# Patient Record
Sex: Male | Born: 1975
Health system: Southern US, Community
[De-identification: ages and names within clinical notes are randomized; demographics above are authoritative.]

## PROBLEM LIST (undated history)

## (undated) DIAGNOSIS — J302 Other seasonal allergic rhinitis: Secondary | ICD-10-CM

## (undated) HISTORY — PX: ABDOMINAL SURGERY: SHX537

---

## 2001-09-17 ENCOUNTER — Emergency Department (HOSPITAL_COMMUNITY): Admission: EM | Admit: 2001-09-17 | Discharge: 2001-09-17 | Payer: Self-pay | Admitting: Emergency Medicine

## 2005-08-27 ENCOUNTER — Emergency Department (HOSPITAL_COMMUNITY): Admission: EM | Admit: 2005-08-27 | Discharge: 2005-08-27 | Payer: Self-pay | Admitting: Emergency Medicine

## 2006-07-15 ENCOUNTER — Emergency Department (HOSPITAL_COMMUNITY): Admission: EM | Admit: 2006-07-15 | Discharge: 2006-07-15 | Payer: Self-pay | Admitting: Family Medicine

## 2006-09-30 ENCOUNTER — Emergency Department (HOSPITAL_COMMUNITY): Admission: EM | Admit: 2006-09-30 | Discharge: 2006-09-30 | Payer: Self-pay | Admitting: Emergency Medicine

## 2007-05-01 ENCOUNTER — Emergency Department (HOSPITAL_COMMUNITY): Admission: EM | Admit: 2007-05-01 | Discharge: 2007-05-01 | Payer: Self-pay | Admitting: Emergency Medicine

## 2008-06-25 ENCOUNTER — Emergency Department (HOSPITAL_COMMUNITY): Admission: EM | Admit: 2008-06-25 | Discharge: 2008-06-25 | Payer: Self-pay | Admitting: Emergency Medicine

## 2008-12-07 ENCOUNTER — Encounter (INDEPENDENT_AMBULATORY_CARE_PROVIDER_SITE_OTHER): Payer: Self-pay | Admitting: General Surgery

## 2008-12-07 ENCOUNTER — Inpatient Hospital Stay (HOSPITAL_COMMUNITY): Admission: EM | Admit: 2008-12-07 | Discharge: 2008-12-12 | Payer: Self-pay | Admitting: Emergency Medicine

## 2009-02-04 ENCOUNTER — Emergency Department (HOSPITAL_COMMUNITY): Admission: EM | Admit: 2009-02-04 | Discharge: 2009-02-04 | Payer: Self-pay | Admitting: Emergency Medicine

## 2009-07-28 ENCOUNTER — Emergency Department (HOSPITAL_COMMUNITY): Admission: EM | Admit: 2009-07-28 | Discharge: 2009-07-28 | Payer: Self-pay | Admitting: Family Medicine

## 2009-11-06 ENCOUNTER — Emergency Department (HOSPITAL_COMMUNITY): Admission: EM | Admit: 2009-11-06 | Discharge: 2009-11-07 | Payer: Self-pay | Admitting: Emergency Medicine

## 2010-05-14 ENCOUNTER — Emergency Department (HOSPITAL_COMMUNITY)
Admission: EM | Admit: 2010-05-14 | Discharge: 2010-05-14 | Payer: Self-pay | Source: Home / Self Care | Admitting: Emergency Medicine

## 2010-07-27 LAB — URINALYSIS, ROUTINE W REFLEX MICROSCOPIC
Glucose, UA: NEGATIVE mg/dL
Hgb urine dipstick: NEGATIVE
Ketones, ur: 15 mg/dL — AB
Protein, ur: NEGATIVE mg/dL
pH: 5 (ref 5.0–8.0)

## 2010-07-27 LAB — COMPREHENSIVE METABOLIC PANEL
AST: 15 U/L (ref 0–37)
Albumin: 3.8 g/dL (ref 3.5–5.2)
Alkaline Phosphatase: 49 U/L (ref 39–117)
BUN: 9 mg/dL (ref 6–23)
CO2: 23 mEq/L (ref 19–32)
GFR calc Af Amer: >60 mL/min (ref >60)
GFR calc non Af Amer: >60 mL/min (ref >60)
Glucose, Bld: 82 mg/dL (ref 70–99)
Potassium: 3.6 mEq/L (ref 3.5–5.1)
Total Bilirubin: 0.4 mg/dL (ref 0.3–1.2)
Total Protein: 7.3 g/dL (ref 6.0–8.3)

## 2010-07-27 LAB — URINE MICROSCOPIC-ADD ON

## 2010-07-27 LAB — DIFFERENTIAL
Basophils Absolute: 0 10*3/uL (ref 0.0–0.1)
Basophils Relative: 0 % (ref 0–1)
Eosinophils Absolute: 0 10*3/uL (ref 0.0–0.7)
Eosinophils Relative: 0 % (ref 0–5)
Lymphocytes Relative: 17 % (ref 12–46)
Monocytes Absolute: 0.5 10*3/uL (ref 0.1–1.0)
Neutro Abs: 5.9 10*3/uL (ref 1.7–7.7)

## 2010-07-27 LAB — CBC
MCV: 84.5 fL (ref 78.0–100.0)
RBC: 4.89 MIL/uL (ref 4.22–5.81)

## 2010-07-27 LAB — LIPASE, BLOOD: Lipase: 22 U/L (ref 11–59)

## 2010-07-29 LAB — BASIC METABOLIC PANEL
BUN: 18 mg/dL (ref 6–23)
CO2: 30 mEq/L (ref 19–32)
Calcium: 7.6 mg/dL — ABNORMAL LOW (ref 8.4–10.5)
Calcium: 8.4 mg/dL (ref 8.4–10.5)
Chloride: 106 mEq/L (ref 96–112)
Creatinine, Ser: 0.73 mg/dL (ref 0.4–1.5)
GFR calc Af Amer: >60 mL/min (ref >60)
GFR calc non Af Amer: >60 mL/min (ref >60)
GFR calc non Af Amer: >60 mL/min (ref >60)
Glucose, Bld: 103 mg/dL — ABNORMAL HIGH (ref 70–99)
Glucose, Bld: 225 mg/dL — ABNORMAL HIGH (ref 70–99)
Potassium: 4.4 mEq/L (ref 3.5–5.1)
Sodium: 137 mEq/L (ref 135–145)

## 2010-07-29 LAB — CROSSMATCH
ABO/RH(D): O POS
Antibody Screen: NEGATIVE

## 2010-07-29 LAB — CBC
HCT: 23.2 % — ABNORMAL LOW (ref 39.0–52.0)
HCT: 45.2 % (ref 39.0–52.0)
Hemoglobin: 14.9 g/dL (ref 13.0–17.0)
Hemoglobin: 6.5 g/dL — CL (ref 13.0–17.0)
Hemoglobin: 6.6 g/dL — CL (ref 13.0–17.0)
Hemoglobin: 7.2 g/dL — CL (ref 13.0–17.0)
Hemoglobin: 7.9 g/dL — CL (ref 13.0–17.0)
Hemoglobin: 8.1 g/dL — ABNORMAL LOW (ref 13.0–17.0)
Hemoglobin: 8.1 g/dL — ABNORMAL LOW (ref 13.0–17.0)
Hemoglobin: 8.6 g/dL — ABNORMAL LOW (ref 13.0–17.0)
MCHC: 34.1 g/dL (ref 30.0–36.0)
MCHC: 34.1 g/dL (ref 30.0–36.0)
MCHC: 34.3 g/dL (ref 30.0–36.0)
MCHC: 34.9 g/dL (ref 30.0–36.0)
MCHC: 35 g/dL (ref 30.0–36.0)
MCV: 85.2 fL (ref 78.0–100.0)
MCV: 86 fL (ref 78.0–100.0)
Platelets: 160 10*3/uL (ref 150–400)
RBC: 2.24 MIL/uL — ABNORMAL LOW (ref 4.22–5.81)
RBC: 2.28 MIL/uL — ABNORMAL LOW (ref 4.22–5.81)
RBC: 2.5 MIL/uL — ABNORMAL LOW (ref 4.22–5.81)
RBC: 2.71 MIL/uL — ABNORMAL LOW (ref 4.22–5.81)
RBC: 2.72 MIL/uL — ABNORMAL LOW (ref 4.22–5.81)
RBC: 2.92 MIL/uL — ABNORMAL LOW (ref 4.22–5.81)
RBC: 5.29 MIL/uL (ref 4.22–5.81)
RDW: 13.6 % (ref 11.5–15.5)
RDW: 14.1 % (ref 11.5–15.5)
RDW: 14.7 % (ref 11.5–15.5)
RDW: 14.7 % (ref 11.5–15.5)
WBC: 12.2 10*3/uL — ABNORMAL HIGH (ref 4.0–10.5)
WBC: 6.2 10*3/uL (ref 4.0–10.5)
WBC: 6.8 10*3/uL (ref 4.0–10.5)
WBC: 8.6 10*3/uL (ref 4.0–10.5)

## 2010-07-29 LAB — DIFFERENTIAL
Basophils Relative: 1 % (ref 0–1)
Eosinophils Absolute: 0 10*3/uL (ref 0.0–0.7)
Lymphocytes Relative: 17 % (ref 12–46)
Lymphs Abs: 1.1 10*3/uL (ref 0.7–4.0)

## 2010-07-29 LAB — COMPREHENSIVE METABOLIC PANEL
ALT: 13 U/L (ref 0–53)
ALT: 14 U/L (ref 0–53)
AST: 22 U/L (ref 0–37)
Albumin: 3.6 g/dL (ref 3.5–5.2)
Alkaline Phosphatase: 32 U/L — ABNORMAL LOW (ref 39–117)
Alkaline Phosphatase: 55 U/L (ref 39–117)
Calcium: 7.9 mg/dL — ABNORMAL LOW (ref 8.4–10.5)
GFR calc Af Amer: >60 mL/min (ref >60)
Glucose, Bld: 110 mg/dL — ABNORMAL HIGH (ref 70–99)
Potassium: 3.7 mEq/L (ref 3.5–5.1)
Sodium: 134 mEq/L — ABNORMAL LOW (ref 135–145)
Total Bilirubin: 0.7 mg/dL (ref 0.3–1.2)
Total Protein: 5 g/dL — ABNORMAL LOW (ref 6.0–8.3)
Total Protein: 7.2 g/dL (ref 6.0–8.3)

## 2010-07-29 LAB — PROTIME-INR
INR: 1.1 (ref 0.00–1.49)
INR: 1.3 (ref 0.00–1.49)
INR: 1.6 — ABNORMAL HIGH (ref 0.00–1.49)
Prothrombin Time: 14.2 seconds (ref 11.6–15.2)
Prothrombin Time: 15.3 seconds — ABNORMAL HIGH (ref 11.6–15.2)
Prothrombin Time: 16 seconds — ABNORMAL HIGH (ref 11.6–15.2)
Prothrombin Time: 19 seconds — ABNORMAL HIGH (ref 11.6–15.2)

## 2010-07-29 LAB — PREPARE RBC (CROSSMATCH)

## 2010-07-29 LAB — PREPARE FRESH FROZEN PLASMA

## 2010-07-29 LAB — APTT: aPTT: 26 seconds (ref 24–37)

## 2010-07-29 LAB — HEMOGLOBIN AND HEMATOCRIT, BLOOD
HCT: 18.5 % — ABNORMAL LOW (ref 39.0–52.0)
Hemoglobin: 10.6 g/dL — ABNORMAL LOW (ref 13.0–17.0)

## 2010-07-29 LAB — ABO/RH: ABO/RH(D): O POS
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
## 2010-09-25 ENCOUNTER — Emergency Department (HOSPITAL_COMMUNITY)
Admission: EM | Admit: 2010-09-25 | Discharge: 2010-09-25 | Discharge status: Against Medical Advice | Payer: Self-pay | Attending: Emergency Medicine | Admitting: Emergency Medicine

## 2010-09-25 DIAGNOSIS — Z0389 Encounter for observation for other suspected diseases and conditions ruled out: Secondary | ICD-10-CM | POA: Insufficient documentation

## 2012-12-28 ENCOUNTER — Encounter (HOSPITAL_COMMUNITY): Payer: Self-pay | Admitting: *Deleted

## 2012-12-28 ENCOUNTER — Emergency Department (INDEPENDENT_AMBULATORY_CARE_PROVIDER_SITE_OTHER)
Admission: EM | Admit: 2012-12-28 | Discharge: 2012-12-28 | Disposition: A | Discharge status: Home / Self Care | Payer: Self-pay | Source: Home / Self Care | Attending: Family Medicine | Admitting: Family Medicine

## 2012-12-28 DIAGNOSIS — L02419 Cutaneous abscess of limb, unspecified: Secondary | ICD-10-CM

## 2012-12-28 MED ORDER — MUPIROCIN CALCIUM 2 % EX CREA: TOPICAL_CREAM | Freq: Three times a day (TID) | CUTANEOUS | Status: DC

## 2012-12-28 MED ORDER — DOXYCYCLINE HYCLATE 100 MG PO CAPS: 100.0000 mg | ORAL_CAPSULE | Freq: Two times a day (BID) | ORAL | Status: DC

## 2012-12-28 NOTE — ED Notes (Signed)
C/O pressure to left temporal region and behind left eye since yesterday; c/o watering to left eye.  Also woke with bleeding gums; denies any unusual bruising or rashes, but c/o lesion behind left knee.  Tiny dome-shaped lesion noted with pinpoint dot in center and swelling surrounding lesion - c/o significant pain to area; denies any drainage.

## 2012-12-28 NOTE — ED Provider Notes (Signed)
CSN: [id number]     Arrival date & time 12/28/12  1104 History   First MD Initiated Contact with Patient 12/28/12 1135     Chief Complaint  Patient presents with  . Facial Pain  . Abscess   (Consider location/radiation/quality/duration/timing/severity/associated sxs/prior Treatment) Patient is a 37 y.o. male presenting with abscess. The history is provided by the patient.  Abscess Location:  Leg Leg abscess location:  L knee Abscess quality: painful and redness   Abscess quality: not draining and no fluctuance   Red streaking: no   Duration:  3 days Progression:  Unchanged Pain details:    Quality:  Burning   Severity:  Mild Chronicity:  New Risk factors: no prior abscess     History reviewed. No pertinent past medical history. History reviewed. No pertinent past surgical history. No family history on file. History  Substance Use Topics  . Smoking status: Former Smoker    Quit date: 12/23/2012  . Smokeless tobacco: Not on file  . Alcohol Use: No    Review of Systems  Constitutional: Negative.   Musculoskeletal: Negative.   Skin: Positive for rash.    Allergies  Review of patient's allergies indicates no known allergies.  Home Medications   Current Outpatient Rx  Name  Route  Sig  Dispense  Refill  . doxycycline (VIBRAMYCIN) 100 MG capsule   Oral   Take 1 capsule (100 mg total) by mouth 2 (two) times daily.   20 capsule   0   . mupirocin cream (BACTROBAN) 2 %   Topical   Apply topically 3 (three) times daily.   30 g   0    BP 132/82  Pulse 66  Temp(Src) 99.2 F (37.3 C) (Oral)  Resp 17  SpO2 100% Physical Exam  Nursing note and vitals reviewed. Constitutional: He is oriented to person, place, and time. He appears well-developed and well-nourished.  Neurological: He is alert and oriented to person, place, and time.  Skin: Skin is warm and dry. Rash noted.  Tender fluctuant abscess to left lat knee area.    ED Course  INCISION AND  DRAINAGE Date/Time: 12/28/2012 11:38 AM Performed by: Linna Hoff Authorized by: Bradd Canary D Consent: Verbal consent obtained. Risks and benefits: risks, benefits and alternatives were discussed Consent given by: patient Type: abscess Body area: lower extremity Location details: left leg Patient sedated: no Scalpel size: 11 Incision type: single straight Complexity: simple Drainage: purulent Drainage amount: moderate Wound treatment: wound left open Packing material: none Patient tolerance: Patient tolerated the procedure well with no immediate complications. Comments: Culture obtained,   (including critical care time) Labs Review Labs Reviewed  CULTURE, ROUTINE-ABSCESS   Imaging Review No results found.  MDM   1. Abscess of lower leg       Linna Hoff, MD 12/28/12 1145

## 2012-12-29 ENCOUNTER — Emergency Department (HOSPITAL_COMMUNITY)
Admission: EM | Admit: 2012-12-29 | Discharge: 2012-12-29 | Disposition: A | Discharge status: Home / Self Care | Payer: Self-pay | Attending: Emergency Medicine | Admitting: Emergency Medicine

## 2012-12-29 ENCOUNTER — Encounter (HOSPITAL_COMMUNITY): Payer: Self-pay | Admitting: Nurse Practitioner

## 2012-12-29 DIAGNOSIS — H5710 Ocular pain, unspecified eye: Secondary | ICD-10-CM | POA: Insufficient documentation

## 2012-12-29 DIAGNOSIS — Z87891 Personal history of nicotine dependence: Secondary | ICD-10-CM | POA: Insufficient documentation

## 2012-12-29 DIAGNOSIS — H5712 Ocular pain, left eye: Secondary | ICD-10-CM

## 2012-12-29 DIAGNOSIS — Z792 Long term (current) use of antibiotics: Secondary | ICD-10-CM | POA: Insufficient documentation

## 2012-12-29 DIAGNOSIS — R51 Headache: Secondary | ICD-10-CM | POA: Insufficient documentation

## 2012-12-29 MED ORDER — PROPARACAINE HCL 0.5 % OP SOLN
1.0000 [drp] | Freq: Once | OPHTHALMIC | Status: AC
  Administered 2012-12-29: 1 [drp] via OPHTHALMIC
  Filled 2012-12-29: qty 15

## 2012-12-29 MED ORDER — FLUORESCEIN SODIUM 1 MG OP STRP
1.0000 | ORAL_STRIP | Freq: Once | OPHTHALMIC | Status: AC
  Administered 2012-12-29: 1 via OPHTHALMIC
  Filled 2012-12-29: qty 1

## 2012-12-29 MED ORDER — FLUTICASONE PROPIONATE 50 MCG/ACT NA SUSP: 2.0000 | Freq: Every day | NASAL | Status: AC

## 2012-12-29 MED ORDER — TRAMADOL HCL 50 MG PO TABS: 50.0000 mg | ORAL_TABLET | Freq: Four times a day (QID) | ORAL | Status: DC | PRN

## 2012-12-29 NOTE — ED Provider Notes (Signed)
CSN: [id number]     Arrival date & time 12/29/12  1457 History  This chart was scribed for non-physician practitioner Wynetta Emery, PA-C working with Loren Racer, MD by Leone Payor, ED Scribe. This patient was seen in room TR04C/TR04C and the patient's care was started at 1457.    Chief Complaint  Patient presents with  . Eye Pain    The history is provided by the patient. No language interpreter was used.    HPI Comments: Travis Curry is a 37 y.o. male who presents to the Emergency Department complaining of 1 day of gradual onset, gradually worsening, constant left eye pain. He describes this pain as a "headache in the eye" with pain that "shoots backwards". He denies any known injuries or scratches to the affected eye. Pain is worse with left to right movement. He reports feeling something on to the top of the eye. He has an associated HA and eye tearing. He wears glasses regularly but denies use of contacts. He has taken tylenol with moderate relief. Pt is also being treated for sinusitis for which he was prescribed doxycycline BID yesterday. He denies rhinorrhea, visual disturbances, photophobia.   History reviewed. No pertinent past medical history. History reviewed. No pertinent past surgical history. History reviewed. No pertinent family history. History  Substance Use Topics  . Smoking status: Former Smoker    Quit date: 12/23/2012  . Smokeless tobacco: Not on file  . Alcohol Use: No    Review of Systems A complete 10 system review of systems was obtained and all systems are negative except as noted in the HPI and PMH.   Allergies  Chocolate  Home Medications   Current Outpatient Rx  Name  Route  Sig  Dispense  Refill  . DiphenhydrAMINE HCl (ALLERGY MED PO)   Oral   Take 1 tablet by mouth daily as needed (allergies).         Marland Kitchen doxycycline (VIBRAMYCIN) 100 MG capsule   Oral   Take 1 capsule (100 mg total) by mouth 2 (two) times daily.   20 capsule   0    . pseudoephedrine-acetaminophen (TYLENOL SINUS) 30-500 MG TABS   Oral   Take 1 tablet by mouth every 4 (four) hours as needed (pain).         . mupirocin cream (BACTROBAN) 2 %   Topical   Apply topically 3 (three) times daily.   30 g   0    BP 134/86  Pulse 71  Temp(Src) 98.5 F (36.9 C) (Oral)  Resp 18  SpO2 98% Physical Exam  Nursing note and vitals reviewed. Constitutional: He is oriented to person, place, and time. He appears well-developed and well-nourished. No distress.  HENT:  Head: Normocephalic.  Eyes: Conjunctivae and EOM are normal. Pupils are equal, round, and reactive to light.  No proptosis or  rash. Temporal arteries are non indurated and there is no TTP   OD 20/20, OS 20/20, OA 20/20  PERRL, EOMI intact with no pain or diplopia on movement, visual fields on confrontation wnl. No nystagmus.  Lids normal, no swelling, left eye bulbar conjunctiva shows moderate injection diffusely, no specific peri-limbic flush.   Lid eversion shows no foreign bodies.   Anterior chamber on slit lamp exam shows normal anterior chamber with no cells or flare, no rust rings noted.   Fluorescein stain on slit lamp exam reveals no corneal abrasion, or dentrites. Seidel sign negative.   Left-sided intraocular pressure of 15 and 8  Neck: Normal range of motion.  Cardiovascular: Normal rate.   Pulmonary/Chest: Effort normal. No stridor.  Abdominal: Soft. Bowel sounds are normal.  Musculoskeletal: Normal range of motion.  Neurological: He is alert and oriented to person, place, and time.  Psychiatric: He has a normal mood and affect.    ED Course  Procedures (including critical care time)  DIAGNOSTIC STUDIES: Oxygen Saturation is 98% on RA, normal by my interpretation.    COORDINATION OF CARE: 5:20 PM Discussed treatment plan with pt at bedside and pt agreed to plan.   Labs Review Labs Reviewed - No data to display Imaging Review No results found.  MDM  No  diagnosis found.  Filed Vitals:   12/29/12 1508  BP: 134/86  Pulse: 71  Temp: 98.5 F (36.9 C)  TempSrc: Oral  Resp: 18  SpO2: 98%     Travis Curry is a 37 y.o. male left eye pain and tearing worsening over the course of the last 4 days. Eye exam is completely within normal limits with no abnormalities noted. I discussed the case with attending Dr. Ranae Palms we have reviewed my exam. I think it is likely that this is a sinus headache or cluster headache rather than a ophthalmologic complaint. I will ask him t follow with an ophthalmologist for full exam and start him on pain medication. Attending physician agrees with assessment and care plan.  Medications  fluorescein ophthalmic strip 1 strip (1 strip Both Eyes Given 12/29/12 1655)  proparacaine (ALCAINE) 0.5 % ophthalmic solution 1 drop (1 drop Both Eyes Given 12/29/12 1655)    Pt is hemodynamically stable, appropriate for, and amenable to discharge at this time. Pt verbalized understanding and agrees with care plan. All questions answered. Outpatient follow-up and specific return precautions discussed.    Discharge Medication List as of 12/29/2012  5:31 PM    START taking these medications   Details  fluticasone (FLONASE) 50 MCG/ACT nasal spray Place 2 sprays into the nose daily., Starting 12/29/2012, Until Discontinued, Print    traMADol (ULTRAM) 50 MG tablet Take 1 tablet (50 mg total) by mouth every 6 (six) hours as needed for pain., Starting 12/29/2012, Until Discontinued, Print        I personally performed the services described in this documentation, which was scribed in my presence. The recorded information has been reviewed and is accurate.  Note: Portions of this report may have been transcribed using voice recognition software. Every effort was made to ensure accuracy; however, inadvertent computerized transcription errors may be present     Wynetta Emery, PA-C 12/31/12 0008

## 2012-12-29 NOTE — ED Notes (Addendum)
C/o L eye pain and tearing x 4 days. Denies any injuries. States his vision seems worse in left eye

## 2012-12-31 LAB — CULTURE, ROUTINE-ABSCESS: Special Requests: NORMAL

## 2012-12-31 NOTE — ED Provider Notes (Signed)
Medical screening examination/treatment/procedure(s) were performed by non-physician practitioner and as supervising physician I was immediately available for consultation/collaboration.   Zalaya Astarita, MD 12/31/12 1511 

## 2013-02-07 ENCOUNTER — Emergency Department (HOSPITAL_COMMUNITY): Payer: Self-pay

## 2013-02-07 ENCOUNTER — Encounter (HOSPITAL_COMMUNITY): Payer: Self-pay | Admitting: Emergency Medicine

## 2013-02-07 ENCOUNTER — Emergency Department (HOSPITAL_COMMUNITY)
Admission: EM | Admit: 2013-02-07 | Discharge: 2013-02-07 | Disposition: A | Discharge status: Home / Self Care | Payer: Self-pay | Attending: Emergency Medicine | Admitting: Emergency Medicine

## 2013-02-07 DIAGNOSIS — S92309A Fracture of unspecified metatarsal bone(s), unspecified foot, initial encounter for closed fracture: Secondary | ICD-10-CM | POA: Insufficient documentation

## 2013-02-07 DIAGNOSIS — Z87891 Personal history of nicotine dependence: Secondary | ICD-10-CM | POA: Insufficient documentation

## 2013-02-07 DIAGNOSIS — IMO0002 Reserved for concepts with insufficient information to code with codable children: Secondary | ICD-10-CM | POA: Insufficient documentation

## 2013-02-07 DIAGNOSIS — Z79899 Other long term (current) drug therapy: Secondary | ICD-10-CM | POA: Insufficient documentation

## 2013-02-07 DIAGNOSIS — S92302A Fracture of unspecified metatarsal bone(s), left foot, initial encounter for closed fracture: Secondary | ICD-10-CM

## 2013-02-07 DIAGNOSIS — Y9239 Other specified sports and athletic area as the place of occurrence of the external cause: Secondary | ICD-10-CM | POA: Insufficient documentation

## 2013-02-07 DIAGNOSIS — X500XXA Overexertion from strenuous movement or load, initial encounter: Secondary | ICD-10-CM | POA: Insufficient documentation

## 2013-02-07 DIAGNOSIS — Y9367 Activity, basketball: Secondary | ICD-10-CM | POA: Insufficient documentation

## 2013-02-07 MED ORDER — HYDROCODONE-ACETAMINOPHEN 5-325 MG PO TABS
2.0000 | ORAL_TABLET | Freq: Once | ORAL | Status: AC
  Administered 2013-02-07: 2 via ORAL
  Filled 2013-02-07: qty 2

## 2013-02-07 MED ORDER — HYDROCODONE-ACETAMINOPHEN 5-325 MG PO TABS: 1.0000 | ORAL_TABLET | Freq: Four times a day (QID) | ORAL | Status: DC | PRN

## 2013-02-07 NOTE — ED Notes (Signed)
Post op shoe with crutches were given to pt. Pt tolerated well and demonstrated use of crutches.

## 2013-02-07 NOTE — ED Notes (Signed)
Pt dc to home.  Pt sts understanding to dc instructions.  Pt taken to car via w/c.   

## 2013-02-07 NOTE — ED Provider Notes (Signed)
CSN: [id number]     Arrival date & time 02/07/13  1920 History  This chart was scribed for non-physician practitioner Dierdre Forth, PA-C  working with Gerhard Munch, MD by Clydene Laming, ED Scribe. This patient was seen in room TR06C/TR06C and the patient's care was started at 7:43 PM.   Chief Complaint  Patient presents with  . Foot Pain    The history is provided by the patient. No language interpreter was used.    HPI Comments: Travis Curry is a 37 y.o. male who presents to the Emergency Department complaining of left foot pain onset yesterday. Pt was playing basketball and went for a lay up and rolled his ankle when he landed. He reports he couldn't sleep due to throbbing of the foot. He states it was swollen this morning and could not apply pressure. He has been unable to weight bear since the initial fall.  He denies hitting his head, loss of consciousness, neck or back pain. He applied bengay and ice packs without relief. Patient has numbness, tingling or weakness.  History reviewed. No pertinent past medical history. History reviewed. No pertinent past surgical history. History reviewed. No pertinent family history. History  Substance Use Topics  . Smoking status: Former Smoker    Quit date: 12/23/2012  . Smokeless tobacco: Not on file  . Alcohol Use: No    Review of Systems  Constitutional: Negative for fever and chills.  Gastrointestinal: Negative for nausea and vomiting.  Musculoskeletal: Positive for arthralgias, gait problem and joint swelling. Negative for back pain, neck pain and neck stiffness.  Skin: Negative for wound.  Neurological: Negative for numbness.  Hematological: Does not bruise/bleed easily.  Psychiatric/Behavioral: The patient is not nervous/anxious.   All other systems reviewed and are negative.    Allergies  Chocolate  Home Medications   Current Outpatient Rx  Name  Route  Sig  Dispense  Refill  . DiphenhydrAMINE HCl (ALLERGY MED  PO)   Oral   Take 1 tablet by mouth daily as needed (allergies).         . fluticasone (FLONASE) 50 MCG/ACT nasal spray   Nasal   Place 2 sprays into the nose daily.   16 g   0   . HYDROcodone-acetaminophen (NORCO/VICODIN) 5-325 MG per tablet   Oral   Take 1-2 tablets by mouth every 6 (six) hours as needed for pain.   21 tablet   0   . pseudoephedrine-acetaminophen (TYLENOL SINUS) 30-500 MG TABS   Oral   Take 1 tablet by mouth every 4 (four) hours as needed (pain).          Pulse 78  Resp 20  SpO2 98% Physical Exam  Nursing note and vitals reviewed. Constitutional: He appears well-developed and well-nourished. No distress.  HENT:  Head: Normocephalic and atraumatic.  Eyes: Conjunctivae are normal.  Neck: Normal range of motion.  Cardiovascular: Normal rate, regular rhythm, normal heart sounds and intact distal pulses.   No murmur heard. Pulses:      Dorsalis pedis pulses are 2+ on the right side, and 2+ on the left side.       Posterior tibial pulses are 2+ on the right side, and 2+ on the left side.  Capillary refill less than 3 secs  Pulmonary/Chest: Effort normal and breath sounds normal.  Musculoskeletal: He exhibits tenderness. He exhibits no edema.  Decreased rom left ankle due to pain Strength is 5/5 Swelling circumfresnially of the left ankle into the dorsal of  the foot with ecchymosis Full rom of the toes of the left foot Significant pain to palpation at the base of the right 5th metatarsal of the left foot  Neurological: He is alert. Coordination normal.  Sensation intact Strength 5/5 including dorsaflexion and plantarflexion   Skin: Skin is warm and dry. He is not diaphoretic.  No tenting of the skin  Psychiatric: He has a normal mood and affect.    ED Course  Procedures (including critical care time) DIAGNOSTIC STUDIES: Oxygen Saturation is 98% on RA, normal by my interpretation.    COORDINATION OF CARE: 7:50 PM- Discussed treatment plan with  pt at bedside. Pt verbalized understanding and agreement with plan.   Labs Review Labs Reviewed - No data to display Imaging Review Dg Foot Complete Left  02/07/2013   CLINICAL DATA:  Twisted ankle, sports injury  EXAM: LEFT FOOT - COMPLETE 3+ VIEW  COMPARISON:  None.  FINDINGS: There is an avulsion fracture at the base of the 5th metatarsal.  IMPRESSION: Fracture of the base of the 5th metatarsal.   Electronically Signed   By: Genevive Bi M.D.   On: 02/07/2013 20:28    EKG Interpretation   None       MDM   1. Fracture of 5th metatarsal, left, closed, initial encounter    Travis Curry presents after foot injury last night.  Patient X-Ray with fracture of the base of the fifth metatarsal of the left foot. Pain managed in ED. Pt advised to follow up with orthopedics for further evaluation and treatment.  Pain managed in the department. Patient given post-op shoe and crutches while in ED, conservative therapy recommended and discussed. Patient will be dc home & is agreeable with above plan. I have also discussed reasons to return immediately to the ER.  Patient expresses understanding and agrees with plan.  It has been determined that no acute conditions requiring further emergency intervention are present at this time. The patient/guardian have been advised of the diagnosis and plan. We have discussed signs and symptoms that warrant return to the ED, such as changes or worsening in symptoms. Patient/guardian has voiced understanding and agreed to follow-up with the PCP or specialist.   I personally performed the services described in this documentation, which was scribed in my presence. The recorded information has been reviewed and is accurate.      Dahlia Client Tristina Sahagian, PA-C 02/07/13 2319

## 2013-02-07 NOTE — ED Provider Notes (Signed)
  Medical screening examination/treatment/procedure(s) were performed by non-physician practitioner and as supervising physician I was immediately available for consultation/collaboration.    Gerhard Munch, MD 02/07/13 [phone]

## 2013-02-07 NOTE — ED Notes (Addendum)
Pt states rolled left ankle yestesday, but the top of his foot is hurting not his ankle. Pt states swelling to the top of his left foot. Palpated pt's left ankle with no pain, but top of left foot painful to touch. Pt states that he was walking yesterday but today the pain is worse.

## 2014-07-19 ENCOUNTER — Emergency Department (INDEPENDENT_AMBULATORY_CARE_PROVIDER_SITE_OTHER)
Admission: EM | Admit: 2014-07-19 | Discharge: 2014-07-19 | Disposition: A | Discharge status: Home / Self Care | Payer: Self-pay | Source: Home / Self Care | Attending: Emergency Medicine | Admitting: Emergency Medicine

## 2014-07-19 ENCOUNTER — Encounter (HOSPITAL_COMMUNITY): Payer: Self-pay | Admitting: Emergency Medicine

## 2014-07-19 DIAGNOSIS — J301 Allergic rhinitis due to pollen: Secondary | ICD-10-CM

## 2014-07-19 DIAGNOSIS — R05 Cough: Secondary | ICD-10-CM

## 2014-07-19 DIAGNOSIS — R059 Cough, unspecified: Secondary | ICD-10-CM

## 2014-07-19 DIAGNOSIS — R0982 Postnasal drip: Secondary | ICD-10-CM

## 2014-07-19 MED ORDER — PREDNISONE 20 MG PO TABS
ORAL_TABLET | ORAL | Status: DC
Start: 2014-07-19 — End: 2016-04-17

## 2014-07-19 MED ORDER — ALBUTEROL SULFATE HFA 108 (90 BASE) MCG/ACT IN AERS: 2.0000 | INHALATION_SPRAY | RESPIRATORY_TRACT | Status: DC | PRN

## 2014-07-19 NOTE — ED Provider Notes (Signed)
CSN: [id number]     Arrival date & time 07/19/14  1028 History   First MD Initiated Contact with Patient 07/19/14 1223     Chief Complaint  Patient presents with  . URI   (Consider location/radiation/quality/duration/timing/severity/associated sxs/prior Treatment) HPI Comments: 39 year old male complaining of an excessive cough, pain in the throat upon coughing, PND, runny nose, watery eyes, decreased energy, occasional chills. He is a smoker. He denies fever. He has been taking Claritin with little to no relief. He was given a dose of TheraFlu last evening and was able to sleep mostly through the night. He frequently wakes up having coughing spasms with lots of mucus in the back of his throat.   History reviewed. No pertinent past medical history. History reviewed. No pertinent past surgical history. No family history on file. History  Substance Use Topics  . Smoking status: Former Smoker    Quit date: 12/23/2012  . Smokeless tobacco: Not on file  . Alcohol Use: No    Review of Systems  Constitutional: Positive for activity change and appetite change. Negative for fever.  HENT: Positive for congestion, postnasal drip, rhinorrhea and sneezing. Negative for ear pain and trouble swallowing.   Eyes: Negative.   Respiratory: Positive for cough. Negative for chest tightness and shortness of breath.   Cardiovascular: Negative for chest pain, palpitations and leg swelling.  Gastrointestinal: Negative.   Skin: Negative for rash.  Neurological: Negative.     Allergies  Chocolate  Home Medications   Prior to Admission medications   Medication Sig Start Date End Date Taking? Authorizing Provider  Chlorphen-Pseudoephed-APAP [facility](THERAFLU FLU/COLD PO) Take by mouth.   Yes Historical Provider, MD  Loratadine (CLARITIN PO) Take by mouth.   Yes Historical Provider, MD  albuterol (PROVENTIL HFA;VENTOLIN HFA) 108 (90 BASE) MCG/ACT inhaler Inhale 2 puffs into the lungs every 4 (four) hours as  needed for wheezing or shortness of breath. 07/19/14   Hayden Rasmussenavid Liller Yohn, NP  DiphenhydrAMINE HCl (ALLERGY MED PO) Take 1 tablet by mouth daily as needed (allergies).    Historical Provider, MD  fluticasone (FLONASE) 50 MCG/ACT nasal spray Place 2 sprays into the nose daily. 12/29/12   Nicole Pisciotta, PA-C  HYDROcodone-acetaminophen (NORCO/VICODIN) 5-325 MG per tablet Take 1-2 tablets by mouth every 6 (six) hours as needed for pain. 02/07/13   Hannah Muthersbaugh, PA-C  predniSONE (DELTASONE) 20 MG tablet Take 3 tabs po on first day, 2 tabs second day, 2 tabs third day, 1 tab fourth day, 1 tab 5th day. Take with food. 07/19/14   Hayden Rasmussenavid Larry Alcock, NP  pseudoephedrine-acetaminophen (TYLENOL SINUS) 30-500 MG TABS Take 1 tablet by mouth every 4 (four) hours as needed (pain).    Historical Provider, MD   BP 122/81 mmHg  Pulse 72  Temp(Src) 99.2 F (37.3 C) (Oral)  Resp 18  SpO2 100% Physical Exam  Constitutional: He is oriented to person, place, and time. He appears well-developed and well-nourished. No distress.  HENT:  Bilateral TMs are normal Oropharynx with minor erythema, cobblestoning and copious amount of thick clear PND.  Eyes: Conjunctivae and EOM are normal.  Neck: Normal range of motion. Neck supple.  Cardiovascular: Normal rate, regular rhythm and normal heart sounds.   Pulmonary/Chest: Effort normal.  Bilateral breath sounds are diminished. No wheezing or coarseness heard.  Musculoskeletal: He exhibits no edema.  Lymphadenopathy:    He has no cervical adenopathy.  Neurological: He is alert and oriented to person, place, and time.  Skin: Skin is warm and dry.  Psychiatric: He has a normal mood and affect.  Nursing note and vitals reviewed.   ED Course  Procedures (including critical care time) Labs Review Labs Reviewed - No data to display  Imaging Review No results found.   MDM   1. Allergic rhinitis due to pollen   2. PND (post-nasal drip)   3. Cough    For Daytime May use  Dayquil with Allegra 180 mg  Use lots of fluids and lots of saline nasal spray May continue the Theraflu or Nyquil at night Robitussin DM Albuterol HFA 2 puffs every 4 hours when necessary as directed Prednisone taper dose as directed Stop smoking    Hayden Rasmussen, NP 07/19/14 1338

## 2014-07-19 NOTE — ED Notes (Signed)
Reports runny nose, cough, sniffling, sore throat with cough, phlegm is white, unknown fever at home, but has had chills.  Onset Sunday 3/27

## 2014-07-19 NOTE — Discharge Instructions (Signed)
Allergic Rhinitis For Daytime May use Dayquil with Allegra 180 mg  Use lots of fluids and lots of saline nasal spray May continue the Theraflu or Nyquil at night Robitussin DM Allergic rhinitis is when the mucous membranes in the nose respond to allergens. Allergens are particles in the air that cause your body to have an allergic reaction. This causes you to release allergic antibodies. Through a chain of events, these eventually cause you to release histamine into the blood stream. Although meant to protect the body, it is this release of histamine that causes your discomfort, such as frequent sneezing, congestion, and an itchy, runny nose.  CAUSES  Seasonal allergic rhinitis (hay fever) is caused by pollen allergens that may come from grasses, trees, and weeds. Year-round allergic rhinitis (perennial allergic rhinitis) is caused by allergens such as house dust mites, pet dander, and mold spores.  SYMPTOMS   Nasal stuffiness (congestion).  Itchy, runny nose with sneezing and tearing of the eyes. DIAGNOSIS  Your health care provider can help you determine the allergen or allergens that trigger your symptoms. If you and your health care provider are unable to determine the allergen, skin or blood testing may be used. TREATMENT  Allergic rhinitis does not have a cure, but it can be controlled by:  Medicines and allergy shots (immunotherapy).  Avoiding the allergen. Hay fever may often be treated with antihistamines in pill or nasal spray forms. Antihistamines block the effects of histamine. There are over-the-counter medicines that may help with nasal congestion and swelling around the eyes. Check with your health care provider before taking or giving this medicine.  If avoiding the allergen or the medicine prescribed do not work, there are many new medicines your health care provider can prescribe. Stronger medicine may be used if initial measures are ineffective. Desensitizing injections can  be used if medicine and avoidance does not work. Desensitization is when a patient is given ongoing shots until the body becomes less sensitive to the allergen. Make sure you follow up with your health care provider if problems continue. HOME CARE INSTRUCTIONS It is not possible to completely avoid allergens, but you can reduce your symptoms by taking steps to limit your exposure to them. It helps to know exactly what you are allergic to so that you can avoid your specific triggers. SEEK MEDICAL CARE IF:   You have a fever.  You develop a cough that does not stop easily (persistent).  You have shortness of breath.  You start wheezing.  Symptoms interfere with normal daily activities. Document Released: 01/02/2001 Document Revised: 04/14/2013 Document Reviewed: 12/15/2012 [facility] Patient Information 2015 [employer], [place]. This information is not intended to replace advice given to you by your health care provider. Make sure you discuss any questions you have with your health care provider.

## 2015-04-14 ENCOUNTER — Emergency Department (HOSPITAL_COMMUNITY)
Admission: EM | Admit: 2015-04-14 | Discharge: 2015-04-14 | Disposition: A | Discharge status: Home / Self Care | Payer: Self-pay | Attending: Emergency Medicine | Admitting: Emergency Medicine

## 2015-04-14 ENCOUNTER — Encounter (HOSPITAL_COMMUNITY): Payer: Self-pay

## 2015-04-14 DIAGNOSIS — Z7951 Long term (current) use of inhaled steroids: Secondary | ICD-10-CM | POA: Insufficient documentation

## 2015-04-14 DIAGNOSIS — Z79899 Other long term (current) drug therapy: Secondary | ICD-10-CM | POA: Insufficient documentation

## 2015-04-14 DIAGNOSIS — J209 Acute bronchitis, unspecified: Secondary | ICD-10-CM | POA: Insufficient documentation

## 2015-04-14 DIAGNOSIS — J40 Bronchitis, not specified as acute or chronic: Secondary | ICD-10-CM

## 2015-04-14 DIAGNOSIS — F17200 Nicotine dependence, unspecified, uncomplicated: Secondary | ICD-10-CM | POA: Insufficient documentation

## 2015-04-14 MED ORDER — AZITHROMYCIN 250 MG PO TABS: 250.0000 mg | ORAL_TABLET | Freq: Every day | ORAL | Status: DC

## 2015-04-14 MED ORDER — ALBUTEROL SULFATE HFA 108 (90 BASE) MCG/ACT IN AERS: 2.0000 | INHALATION_SPRAY | RESPIRATORY_TRACT | Status: DC | PRN

## 2015-04-14 MED ORDER — BENZONATATE 100 MG PO CAPS: 100.0000 mg | ORAL_CAPSULE | Freq: Three times a day (TID) | ORAL | Status: DC

## 2015-04-14 NOTE — ED Notes (Signed)
Pt presents with 5 day h/o cough.  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

## 2015-04-14 NOTE — Discharge Instructions (Signed)
Please obtain all of your results from medical records or have your doctors office obtain the results - share them with your doctor - you should be seen at your doctors office in the next 2 days. Call today to arrange your follow up. Take the medications as prescribed. Please review all of the medicines and only take them if you do not have an allergy to them. Please be aware that if you are taking birth control pills, taking other prescriptions, ESPECIALLY ANTIBIOTICS may make the birth control ineffective - if this is the case, either do not engage in sexual activity or use alternative methods of birth control such as condoms until you have finished the medicine and your family doctor says it is OK to restart them. If you are on a blood thinner such as COUMADIN, be aware that any other medicine that you take may cause the coumadin to either work too much, or not enough - you should have your coumadin level rechecked in next 7 days if this is the case.  °?  °It is also a possibility that you have an allergic reaction to any of the medicines that you have been prescribed - Everybody reacts differently to medications and while MOST people have no trouble with most medicines, you may have a reaction such as nausea, vomiting, rash, swelling, shortness of breath. If this is the case, please stop taking the medicine immediately and contact your physician.  °?  °You should return to the ER if you develop severe or worsening symptoms.  ° ° °Emergency Department Resource Guide °1) Find a Doctor and Pay Out of Pocket °Although you won't have to find out who is covered by your insurance plan, it is a good idea to ask around and get recommendations. You will then need to call the office and see if the doctor you have chosen will accept you as a new patient and what types of options they offer for patients who are self-pay. Some doctors offer discounts or will set up payment plans for their patients who do not have insurance,  but you will need to ask so you aren't surprised when you get to your appointment. ° °2) Contact Your Local Health Department °Not all health departments have doctors that can see patients for sick visits, but many do, so it is worth a call to see if yours does. If you don't know where your local health department is, you can check in your phone book. The CDC also has a tool to help you locate your state's health department, and many state websites also have listings of all of their local health departments. ° °3) Find a Walk-in Clinic °If your illness is not likely to be very severe or complicated, you may want to try a walk in clinic. These are popping up all over the country in pharmacies, drugstores, and shopping centers. They're usually staffed by nurse practitioners or physician assistants that have been trained to treat common illnesses and complaints. They're usually fairly quick and inexpensive. However, if you have serious medical issues or chronic medical problems, these are probably not your best option. ° °No Primary Care Doctor: °- Call [facility] at  832-8000 - they can help you locate a primary care doctor that  accepts your insurance, provides certain services, etc. °- Physician Referral Service- 1-800-533-3463 ° °Chronic Pain Problems: °Organization         Address  Phone   Notes  °[facility]  (336)   297-2271 Patients need to be referred by their primary care doctor.  ° °Medication Assistance: °Organization         Address  Phone   Notes  °[place] [facility] [address] °[place], [place] [address] (336) 641-8030 --Must be a resident of [place] °-- Must have NO insurance coverage whatsoever (no Medicaid/ Medicare, etc.) °-- The pt. MUST have a primary care doctor that directs their care regularly and follows them in the community °  °[facility]  (866) 331-1348   °[employer]  (888) 892-1162   ° °Agencies that provide inexpensive  medical care: °Organization         Address  Phone   Notes  °[facility]  (336) 832-8035   °[facility]    (336) 832-7272   °[facility] [address] °[place], [place] [address] (336) 832-4777   °[facility] [address], °[place] (336) 271-4999   °[facility]    (336) 373-0678   °[facility]    (336) 272-1050   °[facility] ° [address], [place] Phone:  (336) 832-4444, Fax:  (336) 832-4440 Hours of Operation:  9 am - 6 pm, M-F.  Also accepts Medicaid/Medicare and self-pay.  °[facility] ° [address], [place] Phone: (336) 832-3150, Fax: (336) 832-3151. Hours of Operation:  8:30 am - 5:30 pm, M-F.  Also accepts Medicaid and self-pay.  °[facility] [place] [address], [place] Phone: (336) 878-6027   °[facility] [address], [place], [place] (336)723-1848, Ext. 123 Mondays & Thursdays: 7-9 AM.  First 15 patients are seen on a first come, first serve basis. °  ° °Medicaid-accepting [place] Providers: ° °Organization         Address  Phone   Notes  °[facility] [address], [place] (336) 641-2100 Also accepts self-pay patients.  °[facility] [address], [place] ° (336) 856-9996   °[facility] [address], [place] (336) 288-8857   °[facility] [address] [place] [address], [place] (336) 299-7000   °Veita Bland [address], [place]  ° (336) 373-1557 Only accepts [place] Access Medicaid patients after they have their name applied to their card.  ° °Self-Pay (no insurance) in [place]: ° °Organization         Address  Phone   Notes  °Sickle Cell Patients, [facility] [address], [place] (336) 832-1970   °[facility] [address], [place] (336) 832-4400   ° [facility] ° [address], [place] (336) 992-4800   °[facility]/Dr. Osei-Bonsu ° [address] [place] [address], [place] or [address], [place] (336) 841-8500 Phone number for both [place] and [place] locations is the same.  °[facility] [address], [place] (336) 299-0000   °[facility] [address] [place] [address], [place] or [address] (336) 852-7530 °(336) 878-2260   °[facility] [address], [place] (336) 350-1642, phone; (336) 294-5005, fax Sees patients 1st and 3rd Saturday of every month.  Must not qualify for public or private insurance (i.e. Medicaid, Medicare, [place] Health Choice, Veterans' Benefits) •   Household income should be no more than 200% of the poverty level •The clinic cannot treat you if you are pregnant or think you are pregnant • Sexually transmitted diseases are not treated at the clinic.  ° ° °Dental Care: °Organization         Address  Phone  Notes  °[facility] [address], [place] (336) 641-6152 Accepts children up to age 21 who are enrolled in Medicaid or [place] Health Choice; pregnant women with a Medicaid card; and children who have applied for Medicaid or [place] Health Choice, but were declined, whose parents can pay a reduced fee at time of service.  °[facility] [place]  [address], [place] (336) 641-7733 Accepts children up to age 21 who are enrolled in Medicaid or [place] Health Choice; pregnant women with a Medicaid card; and children who have applied for Medicaid or [place] Health Choice, but were declined, whose parents can pay a reduced fee at time of service.  °[facility] ° [address], [place] (336) 641-4533 Patients are seen by appointment only. Walk-ins are not accepted.  [facility] will see patients 18 years of age and older. °Monday - Tuesday (8am-5pm) °Most Wednesdays (8:30-5pm) °$30 per visit, cash only  °[facility] ° [address], [place] (336) 641-4533 Patients are seen by appointment only. Walk-ins are not accepted. [facility] will see patients 18 years of age and older. °One Wednesday Evening (Monthly: Volunteer Based).  $30 per visit, cash only  °[facility]  (919) 537-3737 for adults; Children under age 4, call Graduate Pediatric Dentistry at (919) [phone]. Children aged 4-14, please call (919) [phone] to request a pediatric application. ° Dental services are provided in all areas of dental care including fillings, crowns and bridges, complete and partial dentures, implants, gum treatment, root canals, and extractions. Preventive care is also provided. Treatment is provided to both adults and children. °Patients are selected via a lottery and there is often a waiting list. °  °[facility] [address], °[place] ° (336) 763-8833 [email or web address] °  °[facility] [address], [place], [place] (336)723-1848, Ext. 123 Second and Fourth Thursday of each month, opens at 6:30 AM; Clinic ends at 9 AM.  Patients are seen on a first-come first-served basis, and a limited number are seen during each clinic.  ° °[facility] ° [address], [place], [place] (336) 723-7904   Eligibility Requirements °You must have lived in [place], [place], or [place] counties for at least the last three months. °  You cannot be eligible for state or federal sponsored healthcare insurance, including Veterans Administration, Medicaid, or Medicare. °  You generally cannot be eligible for healthcare insurance through your employer.  °  How to apply: °Eligibility screenings are held every Tuesday and Wednesday afternoon from 1:00 pm until 4:00 pm. You do not need an appointment for the  interview!  °[facility] [address], [place], [place] 336-631-2330   °[facility]  336-342-8273   °[facility]  336-703-3100   °[facility]  336-570-6415   ° °Behavioral Health Resources in the Community: °Intensive Outpatient Programs °Organization         Address  Phone  Notes  °[facility] [address], [place],   [place] 336-878-6098   °[facility] [address], [place], [place] 336-832-9800   °[facility] [address], [place], [place] ° 336-882-2125   °[facility] [address],  °[place], [place] 1-800-853-5163 or 336-641-4981   °Substance Abuse Resources °Organization         Address  Phone  Notes  °[facility]  336-882-2125   °[facility]  336-784-9470   °[facility]  336-285-9073   °[facility]  336-845-3988   °Residential & Outpatient Substance Abuse Program  1-800-659-3381   °Psychological Services °Organization         Address  Phone  Notes  °[facility]  336- 832-9600   °[facility]  336- 378-7881   °[facility] [address], [place] 1-800-853-5163 or 336-641-4981   ° °Mobile Crisis Teams °Organization         Address  Phone  Notes  °[facility], Mobile Crisis Care Unit  1-877-626-1772   °Assertive °[facility] ° [address] [place], [place] 336-834-9664   °Sharon DeEsch [address] °[place] 336-554-5454   ° °Self-Help/Support Groups °Organization         Address  Phone             Notes  °[facility] - variety of support groups  336- 373-1402 Call for more information  °Narcotics Anonymous (NA), [facility] [address], °[place]  2 meetings at this location  ° °Residential Treatment Programs °Organization         Address  Phone  Notes  °[facility]  [address],    °[place]  1-866-801-8205   °[facility] ° [address], [place], [place] 704-293-8524   °[facility] [address], [place] 336-845-3988 Admissions: 8am-3pm M-F  °[facility] [address]    °[place], [place] 336-841-1104   °[facility] [address], [place], [place] 336-379-7146   °[facility] [address]  °[place], [place] 336-285-9073   °[facility] Intensive Outpatient [address], [place], [place] 336-852-3033   °[facility] [address]  °[place], [place] 1-877-615-2722 or 336-784-9470   °[facility] [address] [place], [place] 336-227-7417 Accepts Medicaid  °[facility] [address]  °[place] 1-800-659-3381 Substance Abuse/Addiction Treatment  ° °[place] Behavioral Health Resources °Organization         Address  Phone  Notes  °[facility]  (888) 581-9988   °Julie Brannon, PhD [address] [place], [place]   (336) 349-5553 or (336) 951-0000   °[facility]   [address] °[place], [place] (336) 349-4454   °[facility] [address], [place], [place] (336) 342-8316 Insurance/Medicaid/sponsorship through [facility]  °[facility] [address]                                    [place], [place] (336) 342-8316 Therapy/tele-psych/case  °[facility] [address]  ° [place], [place] (336) 349-2233    °Dr. Arfeen  (336) 349-4544   °[facility] [place]  [employer] [place] [facility] 1) [address], [place] °2) [address], [place] °3)  [address], [place] (336) 349-3220 °(336) 342-7768 ° °(336) 342-8140   °[place] [facility] (336) 342-1394 or (336) 342-3537 (After Hours)    ° ° ° °
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
## 2015-08-14 ENCOUNTER — Emergency Department (HOSPITAL_COMMUNITY)
Admission: EM | Admit: 2015-08-14 | Discharge: 2015-08-14 | Disposition: A | Discharge status: Home / Self Care | Payer: Self-pay | Attending: Emergency Medicine | Admitting: Emergency Medicine

## 2015-08-14 ENCOUNTER — Encounter (HOSPITAL_COMMUNITY): Payer: Self-pay | Admitting: *Deleted

## 2015-08-14 DIAGNOSIS — F17200 Nicotine dependence, unspecified, uncomplicated: Secondary | ICD-10-CM | POA: Insufficient documentation

## 2015-08-14 DIAGNOSIS — Z7951 Long term (current) use of inhaled steroids: Secondary | ICD-10-CM | POA: Insufficient documentation

## 2015-08-14 DIAGNOSIS — Z79899 Other long term (current) drug therapy: Secondary | ICD-10-CM | POA: Insufficient documentation

## 2015-08-14 DIAGNOSIS — J069 Acute upper respiratory infection, unspecified: Secondary | ICD-10-CM

## 2015-08-14 NOTE — Discharge Instructions (Signed)
Use OTC sinus, cold and flu medications such as tylenol, motrin, delsym, mucinex or other found at your local pharmacy. Return to ED with new, worsening or concerning symptoms.    Upper Respiratory Infection, Adult Most upper respiratory infections (URIs) are a viral infection of the air passages leading to the lungs. A URI affects the nose, throat, and upper air passages. The most common type of URI is nasopharyngitis and is typically referred to as "the common cold." URIs run their course and usually go away on their own. Most of the time, a URI does not require medical attention, but sometimes a bacterial infection in the upper airways can follow a viral infection. This is called a secondary infection. Sinus and middle ear infections are common types of secondary upper respiratory infections. Bacterial pneumonia can also complicate a URI. A URI can worsen asthma and chronic obstructive pulmonary disease (COPD). Sometimes, these complications can require emergency medical care and may be life threatening.  CAUSES Almost all URIs are caused by viruses. A virus is a type of germ and can spread from one person to another.  RISKS FACTORS You may be at risk for a URI if:   You smoke.   You have chronic heart or lung disease.  You have a weakened defense (immune) system.   You are very young or very old.   You have nasal allergies or asthma.  You work in crowded or poorly ventilated areas.  You work in health care facilities or schools. SIGNS AND SYMPTOMS  Symptoms typically develop 2-3 days after you come in contact with a cold virus. Most viral URIs last 7-10 days. However, viral URIs from the influenza virus (flu virus) can last 14-18 days and are typically more severe. Symptoms may include:   Runny or stuffy (congested) nose.   Sneezing.   Cough.   Sore throat.   Headache.   Fatigue.   Fever.   Loss of appetite.   Pain in your forehead, behind your eyes, and  over your cheekbones (sinus pain).  Muscle aches.  DIAGNOSIS  Your health care provider may diagnose a URI by:  Physical exam.  Tests to check that your symptoms are not due to another condition such as:  Strep throat.  Sinusitis.  Pneumonia.  Asthma. TREATMENT  A URI goes away on its own with time. It cannot be cured with medicines, but medicines may be prescribed or recommended to relieve symptoms. Medicines may help:  Reduce your fever.  Reduce your cough.  Relieve nasal congestion. HOME CARE INSTRUCTIONS   Take medicines only as directed by your health care provider.   Gargle warm saltwater or take cough drops to comfort your throat as directed by your health care provider.  Use a warm mist humidifier or inhale steam from a shower to increase air moisture. This may make it easier to breathe.  Drink enough fluid to keep your urine clear or pale yellow.   Eat soups and other clear broths and maintain good nutrition.   Rest as needed.   Return to work when your temperature has returned to normal or as your health care provider advises. You may need to stay home longer to avoid infecting others. You can also use a face mask and careful hand washing to prevent spread of the virus.  Increase the usage of your inhaler if you have asthma.   Do not use any tobacco products, including cigarettes, chewing tobacco, or electronic cigarettes. If you need help quitting, ask  your health care provider. PREVENTION  The best way to protect yourself from getting a cold is to practice good hygiene.   Avoid oral or hand contact with people with cold symptoms.   Wash your hands often if contact occurs.  There is no clear evidence that vitamin C, vitamin E, echinacea, or exercise reduces the chance of developing a cold. However, it is always recommended to get plenty of rest, exercise, and practice good nutrition.  SEEK MEDICAL CARE IF:   You are getting worse rather than  better.   Your symptoms are not controlled by medicine.   You have chills.  You have worsening shortness of breath.  You have brown or red mucus.  You have yellow or brown nasal discharge.  You have pain in your face, especially when you bend forward.  You have a fever.  You have swollen neck glands.  You have pain while swallowing.  You have white areas in the back of your throat. SEEK IMMEDIATE MEDICAL CARE IF:   You have severe or persistent:  Headache.  Ear pain.  Sinus pain.  Chest pain.  You have chronic lung disease and any of the following:  Wheezing.  Prolonged cough.  Coughing up blood.  A change in your usual mucus.  You have a stiff neck.  You have changes in your:  Vision.  Hearing.  Thinking.  Mood. MAKE SURE YOU:   Understand these instructions.  Will watch your condition.  Will get help right away if you are not doing well or get worse.   This information is not intended to replace advice given to you by your health care provider. Make sure you discuss any questions you have with your health care provider.   Document Released: 10/03/2000 Document Revised: 08/24/2014 Document Reviewed: 07/15/2013 Elsevier Interactive Patient Education [employer].

## 2015-08-14 NOTE — ED Notes (Signed)
Pt reports worse cough starting last night.

## 2015-08-14 NOTE — ED Notes (Signed)
Declined W/C at D/C and was escorted to lobby by RN. 
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
## 2016-04-17 ENCOUNTER — Emergency Department (HOSPITAL_COMMUNITY)
Admission: EM | Admit: 2016-04-17 | Discharge: 2016-04-17 | Disposition: A | Discharge status: Home / Self Care | Payer: Self-pay | Attending: Emergency Medicine | Admitting: Emergency Medicine

## 2016-04-17 ENCOUNTER — Encounter (HOSPITAL_COMMUNITY): Payer: Self-pay | Admitting: *Deleted

## 2016-04-17 DIAGNOSIS — F17200 Nicotine dependence, unspecified, uncomplicated: Secondary | ICD-10-CM | POA: Insufficient documentation

## 2016-04-17 DIAGNOSIS — J40 Bronchitis, not specified as acute or chronic: Secondary | ICD-10-CM | POA: Insufficient documentation

## 2016-04-17 MED ORDER — BENZONATATE 100 MG PO CAPS
100.0000 mg | ORAL_CAPSULE | Freq: Once | ORAL | Status: AC
  Administered 2016-04-17: 100 mg via ORAL
  Filled 2016-04-17: qty 1

## 2016-04-17 MED ORDER — BENZONATATE 100 MG PO CAPS: 100.0000 mg | ORAL_CAPSULE | Freq: Three times a day (TID) | ORAL | 0 refills | Status: DC

## 2016-04-17 MED ORDER — PREDNISONE 20 MG PO TABS
60.0000 mg | ORAL_TABLET | Freq: Once | ORAL | Status: AC
  Administered 2016-04-17: 60 mg via ORAL
  Filled 2016-04-17: qty 3

## 2016-04-17 MED ORDER — ALBUTEROL SULFATE HFA 108 (90 BASE) MCG/ACT IN AERS
2.0000 | INHALATION_SPRAY | Freq: Once | RESPIRATORY_TRACT | Status: AC
  Administered 2016-04-17: 2 via RESPIRATORY_TRACT
  Filled 2016-04-17: qty 6.7

## 2016-04-17 MED ORDER — PREDNISONE 20 MG PO TABS: 40.0000 mg | ORAL_TABLET | Freq: Every day | ORAL | 0 refills | Status: DC
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
## 2016-04-17 NOTE — ED Triage Notes (Signed)
Patient presents stating he feels like his bronchitis has flared up again.  +cough with slight production of clear white   Lungs clear  Inhaler out antibiotics completed

## 2016-04-17 NOTE — Discharge Instructions (Signed)
This is likely bronchitis. You need to take the prednisone 2 tablets once a day for 3 days. You also need to use the inhaler as needed. Please take the tessalon as prescribed for cough. Return to the ED if your symptoms worsen or if you develop fevers, cough up more thick sputum, or for any other reason. Follow up with your primary care doctor as needed.

## 2016-04-17 NOTE — ED Notes (Signed)
Pt sts he was not dx w/ bronchitis, he just believes he has the same s/s as when he was dx w/ bronchitis.

## 2016-05-03 ENCOUNTER — Emergency Department (HOSPITAL_COMMUNITY)
Admission: EM | Admit: 2016-05-03 | Discharge: 2016-05-03 | Disposition: A | Discharge status: Home / Self Care | Payer: Self-pay | Attending: Emergency Medicine | Admitting: Emergency Medicine

## 2016-05-03 ENCOUNTER — Encounter (HOSPITAL_COMMUNITY): Payer: Self-pay

## 2016-05-03 DIAGNOSIS — R21 Rash and other nonspecific skin eruption: Secondary | ICD-10-CM | POA: Insufficient documentation

## 2016-05-03 DIAGNOSIS — F17200 Nicotine dependence, unspecified, uncomplicated: Secondary | ICD-10-CM | POA: Insufficient documentation

## 2016-05-03 DIAGNOSIS — Z79899 Other long term (current) drug therapy: Secondary | ICD-10-CM | POA: Insufficient documentation

## 2016-05-03 MED ORDER — DIPHENHYDRAMINE HCL 25 MG PO CAPS: 25.0000 mg | ORAL_CAPSULE | Freq: Four times a day (QID) | ORAL | 0 refills | Status: DC | PRN

## 2016-05-03 MED ORDER — PERMETHRIN 5 % EX CREA: TOPICAL_CREAM | CUTANEOUS | 1 refills | Status: DC

## 2016-05-03 NOTE — Discharge Instructions (Signed)
Please read and follow all provided instructions.  Your diagnoses today include:  1. Rash and nonspecific skin eruption     Tests performed today include:  Vital signs. See below for your results today.   Medications prescribed:   Permethrin cream - Apply to entire body before bed and wash off in morning, repeat in one week if not resolved.   Benadryl (diphenhydramine) - antihistamine  You can find this medication over-the-counter.   DO NOT exceed:   50mg  Benadryl every 6 hours    Benadryl will make you drowsy. DO NOT drive or perform any activities that require you to be awake and alert if taking this.  Home care instructions:  Follow any educational materials contained in this packet.  You can use benadryl as directed on packaging for itching. You may also use loradine (Claritin) as directed as this medication will not make you very sleepy.   Do not use hydrocortisone or any other type of steroid cream -- it will not kill the scabies parasite and will make the rash worse.   You need to disinfect surfaces, vacuum floors, and wash all clothes and bedding in hot water.   Follow-up instructions: Please follow-up with your primary care provider as needed for further evaluation of your symptoms.  Return instructions:   Please return to the Emergency Department if you experience worsening symptoms.   Please return if you have any other emergent concerns.  Additional Information:  Your vital signs today were: BP 117/75 (BP Location: Left Arm)    Pulse 74    Temp 98.4 F (36.9 C) (Oral)    Resp 18    Ht 5\' 8"  (1.727 m)    Wt 63.5 kg    SpO2 100%    BMI 21.29 kg/m  If your blood pressure (BP) was elevated above 135/85 this visit, please have this repeated by your doctor within one month. ---------------

## 2016-05-03 NOTE — ED Triage Notes (Signed)
Per Pt, Pt is coming from home with complaints of rash noted to his groin. Pt denies any penile discharge. Pt reports rash itching.

## 2016-05-03 NOTE — ED Notes (Signed)
Video visit coupon #[id number]
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
## 2017-05-18 ENCOUNTER — Emergency Department (HOSPITAL_COMMUNITY)
Admission: EM | Admit: 2017-05-18 | Discharge: 2017-05-18 | Disposition: A | Discharge status: Home / Self Care | Payer: Commercial Managed Care - PPO | Attending: Emergency Medicine | Admitting: Emergency Medicine

## 2017-05-18 ENCOUNTER — Other Ambulatory Visit: Payer: Self-pay

## 2017-05-18 ENCOUNTER — Emergency Department (HOSPITAL_COMMUNITY): Payer: Commercial Managed Care - PPO

## 2017-05-18 ENCOUNTER — Encounter (HOSPITAL_COMMUNITY): Payer: Self-pay | Admitting: Emergency Medicine

## 2017-05-18 DIAGNOSIS — Z79899 Other long term (current) drug therapy: Secondary | ICD-10-CM | POA: Insufficient documentation

## 2017-05-18 DIAGNOSIS — N201 Calculus of ureter: Secondary | ICD-10-CM | POA: Diagnosis not present

## 2017-05-18 DIAGNOSIS — F17210 Nicotine dependence, cigarettes, uncomplicated: Secondary | ICD-10-CM | POA: Insufficient documentation

## 2017-05-18 DIAGNOSIS — R1032 Left lower quadrant pain: Secondary | ICD-10-CM | POA: Diagnosis present

## 2017-05-18 DIAGNOSIS — N1330 Unspecified hydronephrosis: Secondary | ICD-10-CM | POA: Diagnosis not present

## 2017-05-18 DIAGNOSIS — N132 Hydronephrosis with renal and ureteral calculous obstruction: Secondary | ICD-10-CM | POA: Insufficient documentation

## 2017-05-18 LAB — CBC
HEMATOCRIT: 43.5 % (ref 39.0–52.0)
HEMOGLOBIN: 14.6 g/dL (ref 13.0–17.0)
MCH: 27.2 pg (ref 26.0–34.0)
MCHC: 33.6 g/dL (ref 30.0–36.0)
MCV: 81 fL (ref 78.0–100.0)
Platelets: 301 10*3/uL (ref 150–400)
RBC: 5.37 MIL/uL (ref 4.22–5.81)
RDW: 13.6 % (ref 11.5–15.5)
WBC: 8.8 10*3/uL (ref 4.0–10.5)

## 2017-05-18 LAB — COMPREHENSIVE METABOLIC PANEL
ALBUMIN: 3.7 g/dL (ref 3.5–5.0)
ALK PHOS: 66 U/L (ref 38–126)
ALT: 17 U/L (ref 17–63)
ANION GAP: 14 (ref 5–15)
AST: 21 U/L (ref 15–41)
BUN: 10 mg/dL (ref 6–20)
CALCIUM: 9.3 mg/dL (ref 8.9–10.3)
CO2: 23 mmol/L (ref 22–32)
Chloride: 103 mmol/L (ref 101–111)
Creatinine, Ser: 1.28 mg/dL — ABNORMAL HIGH (ref 0.61–1.24)
GFR calc non Af Amer: >60 mL/min (ref >60)
GLUCOSE: 106 mg/dL — AB (ref 65–99)
POTASSIUM: 3.4 mmol/L — AB (ref 3.5–5.1)
Sodium: 140 mmol/L (ref 135–145)
TOTAL PROTEIN: 7.4 g/dL (ref 6.5–8.1)
Total Bilirubin: 0.4 mg/dL (ref 0.3–1.2)

## 2017-05-18 LAB — URINALYSIS, ROUTINE W REFLEX MICROSCOPIC
Bacteria, UA: NONE SEEN
Bilirubin Urine: NEGATIVE
Glucose, UA: NEGATIVE mg/dL
KETONES UR: NEGATIVE mg/dL
Leukocytes, UA: NEGATIVE
NITRITE: NEGATIVE
PH: 9 — AB (ref 5.0–8.0)
Protein, ur: 30 mg/dL — AB
Specific Gravity, Urine: 1.023 (ref 1.005–1.030)

## 2017-05-18 LAB — LIPASE, BLOOD: Lipase: 35 U/L (ref 11–51)

## 2017-05-18 MED ORDER — OXYCODONE-ACETAMINOPHEN 5-325 MG PO TABS
1.0000 | ORAL_TABLET | Freq: Once | ORAL | Status: AC
Start: 2017-05-18 — End: 2017-05-18
  Administered 2017-05-18: 1 via ORAL
  Filled 2017-05-18: qty 1

## 2017-05-18 MED ORDER — SODIUM CHLORIDE 0.9 % IV BOLUS (SEPSIS)
1000.0000 mL | Freq: Once | INTRAVENOUS | Status: AC
  Administered 2017-05-18: 1000 mL via INTRAVENOUS

## 2017-05-18 MED ORDER — ONDANSETRON HCL 4 MG/2ML IJ SOLN
4.0000 mg | Freq: Once | INTRAMUSCULAR | Status: AC
  Administered 2017-05-18: 4 mg via INTRAVENOUS
  Filled 2017-05-18: qty 2

## 2017-05-18 MED ORDER — ONDANSETRON 4 MG PO TBDP: 4.0000 mg | ORAL_TABLET | Freq: Three times a day (TID) | ORAL | 0 refills | Status: DC | PRN

## 2017-05-18 MED ORDER — MORPHINE SULFATE (PF) 4 MG/ML IV SOLN
4.0000 mg | Freq: Once | INTRAVENOUS | Status: AC
Start: 2017-05-18 — End: 2017-05-18
  Administered 2017-05-18: 4 mg via INTRAVENOUS
  Filled 2017-05-18: qty 1

## 2017-05-18 MED ORDER — TRAMADOL HCL 50 MG PO TABS: 50.0000 mg | ORAL_TABLET | Freq: Four times a day (QID) | ORAL | 0 refills | Status: DC | PRN

## 2017-05-18 MED ORDER — TAMSULOSIN HCL 0.4 MG PO CAPS: 0.4000 mg | ORAL_CAPSULE | Freq: Every day | ORAL | 0 refills | Status: DC

## 2017-05-18 NOTE — Discharge Instructions (Signed)
You have been diagnosed with kidney stones.  Make sure you are taking Motrin and Tylenol around-the-clock.  Take the tramadol for pain that is not controlled by these medications.  Drink plenty of fluids to help you pass the stone.  Take  ibuprofen / naproxen as directed with food for mild to moderate pain. Use your pain medication as directed and only as needed for severe pain. Taking flomax as directed will also help to pass the stone. Use Zofran for nausea as directed.   Follow up with the urology clinic listed in regards to your hospital visit.   Return to the ED immediately if you develop fever that persists > 101, uncontrolled pain or vomiting, or other concerns.   Do not drink alcohol, drive or participate in any other potentially dangerous activities while taking opiate pain medication as it may make you sleepy. Do not take this medication with any other sedating medications, either prescription or over-the-counter. If you were prescribed Percocet or Vicodin, do not take these with acetaminophen (Tylenol) as it is already contained within these medications.   This medication is an opiate (or narcotic) pain medication and can be habit forming.  Use it as little as possible to achieve adequate pain control.  Do not use or use it with extreme caution if you have a history of opiate abuse or dependence. This medication is intended for your use only - do not give any to anyone else and keep it in a secure place where nobody else, especially children, have access to it. It will also cause or worsen constipation, so you may want to consider taking an over-the-counter stool softener while you are taking this medication.
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
## 2017-05-18 NOTE — ED Triage Notes (Signed)
Reports L sided abd pain x 2 days with nausea and vomiting.  Last BM this morning- WNL.

## 2017-05-21 DIAGNOSIS — N201 Calculus of ureter: Secondary | ICD-10-CM | POA: Diagnosis not present

## 2017-05-28 DIAGNOSIS — N201 Calculus of ureter: Secondary | ICD-10-CM | POA: Diagnosis not present

## 2017-10-29 ENCOUNTER — Encounter (HOSPITAL_COMMUNITY): Payer: Self-pay

## 2017-10-29 ENCOUNTER — Encounter (HOSPITAL_COMMUNITY): Payer: Self-pay | Admitting: Emergency Medicine

## 2017-10-29 ENCOUNTER — Ambulatory Visit (HOSPITAL_COMMUNITY)
Admission: EM | Admit: 2017-10-29 | Discharge: 2017-10-29 | Disposition: A | Discharge status: Home / Self Care | Payer: Commercial Managed Care - PPO | Source: Home / Self Care | Attending: Family Medicine | Admitting: Family Medicine

## 2017-10-29 ENCOUNTER — Emergency Department (HOSPITAL_COMMUNITY): Payer: Commercial Managed Care - PPO

## 2017-10-29 ENCOUNTER — Emergency Department (HOSPITAL_COMMUNITY)
Admission: EM | Admit: 2017-10-29 | Discharge: 2017-10-30 | Disposition: A | Discharge status: Home / Self Care | Payer: Commercial Managed Care - PPO | Attending: Emergency Medicine | Admitting: Emergency Medicine

## 2017-10-29 DIAGNOSIS — R42 Dizziness and giddiness: Secondary | ICD-10-CM

## 2017-10-29 DIAGNOSIS — R110 Nausea: Secondary | ICD-10-CM | POA: Diagnosis not present

## 2017-10-29 DIAGNOSIS — R079 Chest pain, unspecified: Secondary | ICD-10-CM

## 2017-10-29 DIAGNOSIS — R0602 Shortness of breath: Secondary | ICD-10-CM | POA: Diagnosis not present

## 2017-10-29 DIAGNOSIS — R0789 Other chest pain: Secondary | ICD-10-CM | POA: Diagnosis not present

## 2017-10-29 DIAGNOSIS — R002 Palpitations: Secondary | ICD-10-CM | POA: Diagnosis not present

## 2017-10-29 LAB — BASIC METABOLIC PANEL
Anion gap: 8 (ref 5–15)
BUN: 12 mg/dL (ref 6–20)
CHLORIDE: 102 mmol/L (ref 98–111)
CO2: 25 mmol/L (ref 22–32)
CREATININE: 0.96 mg/dL (ref 0.61–1.24)
Calcium: 8.9 mg/dL (ref 8.9–10.3)
GFR calc non Af Amer: >60 mL/min (ref >60)
Glucose, Bld: 103 mg/dL — ABNORMAL HIGH (ref 70–99)
POTASSIUM: 3.4 mmol/L — AB (ref 3.5–5.1)
SODIUM: 135 mmol/L (ref 135–145)

## 2017-10-29 LAB — CBC
HEMATOCRIT: 44.5 % (ref 39.0–52.0)
Hemoglobin: 14.1 g/dL (ref 13.0–17.0)
MCH: 26.4 pg (ref 26.0–34.0)
MCHC: 31.7 g/dL (ref 30.0–36.0)
MCV: 83.3 fL (ref 78.0–100.0)
PLATELETS: 341 10*3/uL (ref 150–400)
RBC: 5.34 MIL/uL (ref 4.22–5.81)
RDW: 13.4 % (ref 11.5–15.5)
WBC: 5.8 10*3/uL (ref 4.0–10.5)

## 2017-10-29 LAB — I-STAT TROPONIN, ED: Troponin i, poc: 0.01 ng/mL (ref 0.00–0.08)

## 2017-10-29 NOTE — ED Triage Notes (Signed)
PT reports central chest pain that started yesterday. PT endorses dizziness and nausea. PT reports irregular heart beat when he walks or bends over.
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
## 2017-10-29 NOTE — ED Triage Notes (Signed)
Pt reports that this afternoon he began to have central CP with dizziness, diaphoresis, and felt his heart was racing, denies SOB.
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
## 2017-10-29 NOTE — ED Provider Notes (Signed)
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   Initiation of care has begun. The patient has been counseled on the process, plan, and necessity for staying for the completion/evaluation, and the remainder of the medical screening examination    Otho Perlran, Sharone Almond, PA-C 10/29/17 2051    Eber HongMiller, Brian, MD 11/01/17 0700

## 2017-10-29 NOTE — Discharge Instructions (Signed)
Please go to emergency room for further evaluation.

## 2017-10-30 DIAGNOSIS — R002 Palpitations: Secondary | ICD-10-CM | POA: Diagnosis not present

## 2017-10-30 DIAGNOSIS — R079 Chest pain, unspecified: Secondary | ICD-10-CM | POA: Diagnosis not present

## 2017-10-30 LAB — I-STAT TROPONIN, ED: TROPONIN I, POC: 0 ng/mL (ref 0.00–0.08)

## 2017-10-30 LAB — D-DIMER, QUANTITATIVE (NOT AT ARMC): D DIMER QUANT: 0.33 ug{FEU}/mL (ref 0.00–0.50)

## 2017-10-30 NOTE — ED Notes (Signed)
Pt departed in NAD, refused use of wheelchair.  

## 2018-03-25 DIAGNOSIS — Z136 Encounter for screening for cardiovascular disorders: Secondary | ICD-10-CM | POA: Diagnosis not present

## 2018-03-25 DIAGNOSIS — Z131 Encounter for screening for diabetes mellitus: Secondary | ICD-10-CM | POA: Diagnosis not present

## 2018-03-25 DIAGNOSIS — Z23 Encounter for immunization: Secondary | ICD-10-CM | POA: Diagnosis not present

## 2018-03-25 DIAGNOSIS — Z789 Other specified health status: Secondary | ICD-10-CM | POA: Diagnosis not present

## 2018-03-25 DIAGNOSIS — Z0000 Encounter for general adult medical examination without abnormal findings: Secondary | ICD-10-CM | POA: Diagnosis not present

## 2018-05-20 DIAGNOSIS — R3121 Asymptomatic microscopic hematuria: Secondary | ICD-10-CM | POA: Diagnosis not present

## 2018-05-20 DIAGNOSIS — N471 Phimosis: Secondary | ICD-10-CM | POA: Diagnosis not present

## 2019-03-21 ENCOUNTER — Encounter: Payer: Self-pay | Admitting: Intensive Care

## 2019-03-21 ENCOUNTER — Emergency Department: Payer: Commercial Managed Care - PPO

## 2019-03-21 ENCOUNTER — Emergency Department
Admission: EM | Admit: 2019-03-21 | Discharge: 2019-03-21 | Disposition: A | Discharge status: Home / Self Care | Payer: Commercial Managed Care - PPO | Attending: Emergency Medicine | Admitting: Emergency Medicine

## 2019-03-21 ENCOUNTER — Other Ambulatory Visit: Payer: Self-pay

## 2019-03-21 DIAGNOSIS — M546 Pain in thoracic spine: Secondary | ICD-10-CM | POA: Diagnosis not present

## 2019-03-21 DIAGNOSIS — Y999 Unspecified external cause status: Secondary | ICD-10-CM | POA: Insufficient documentation

## 2019-03-21 DIAGNOSIS — Z91018 Allergy to other foods: Secondary | ICD-10-CM | POA: Insufficient documentation

## 2019-03-21 DIAGNOSIS — M545 Low back pain: Secondary | ICD-10-CM | POA: Diagnosis not present

## 2019-03-21 DIAGNOSIS — S0990XA Unspecified injury of head, initial encounter: Secondary | ICD-10-CM | POA: Diagnosis not present

## 2019-03-21 DIAGNOSIS — Y92410 Unspecified street and highway as the place of occurrence of the external cause: Secondary | ICD-10-CM | POA: Diagnosis not present

## 2019-03-21 DIAGNOSIS — F17210 Nicotine dependence, cigarettes, uncomplicated: Secondary | ICD-10-CM | POA: Diagnosis not present

## 2019-03-21 DIAGNOSIS — Z79899 Other long term (current) drug therapy: Secondary | ICD-10-CM | POA: Insufficient documentation

## 2019-03-21 DIAGNOSIS — S161XXA Strain of muscle, fascia and tendon at neck level, initial encounter: Secondary | ICD-10-CM | POA: Diagnosis not present

## 2019-03-21 DIAGNOSIS — Y9389 Activity, other specified: Secondary | ICD-10-CM | POA: Diagnosis not present

## 2019-03-21 DIAGNOSIS — S199XXA Unspecified injury of neck, initial encounter: Secondary | ICD-10-CM | POA: Diagnosis present

## 2019-03-21 DIAGNOSIS — M7918 Myalgia, other site: Secondary | ICD-10-CM

## 2019-03-21 MED ORDER — NAPROXEN 500 MG PO TABS: 500.0000 mg | ORAL_TABLET | Freq: Two times a day (BID) | ORAL | 0 refills | Status: AC

## 2019-03-21 MED ORDER — HYDROCODONE-ACETAMINOPHEN 5-325 MG PO TABS: 1.0000 | ORAL_TABLET | Freq: Four times a day (QID) | ORAL | 0 refills | Status: DC | PRN

## 2019-03-21 MED ORDER — HYDROCODONE-ACETAMINOPHEN 5-325 MG PO TABS
1.0000 | ORAL_TABLET | Freq: Once | ORAL | Status: DC
  Filled 2019-03-21: qty 1
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
## 2019-03-21 NOTE — Discharge Instructions (Signed)
Follow-up with your primary care provider if any continued problems.  2 medications were sent to your pharmacy.  Naproxen is twice a day every day with food for soreness and inflammation.  The Norco is a narcotic and should not be taken while driving or operating machinery.  You may use ice or heat to your muscles as needed for discomfort.  Soreness usually improves in 4 to 5 days.
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
## 2019-04-15 ENCOUNTER — Ambulatory Visit: Payer: Commercial Managed Care - PPO | Admitting: Orthopaedic Surgery

## 2019-07-07 ENCOUNTER — Encounter (HOSPITAL_COMMUNITY): Payer: Self-pay

## 2019-07-07 ENCOUNTER — Other Ambulatory Visit: Payer: Self-pay

## 2019-07-07 ENCOUNTER — Ambulatory Visit (HOSPITAL_COMMUNITY)
Admission: EM | Admit: 2019-07-07 | Discharge: 2019-07-07 | Disposition: A | Discharge status: Home / Self Care | Payer: Commercial Managed Care - PPO | Attending: Family Medicine | Admitting: Family Medicine

## 2019-07-07 DIAGNOSIS — J310 Chronic rhinitis: Secondary | ICD-10-CM | POA: Diagnosis not present

## 2019-07-07 DIAGNOSIS — Z79899 Other long term (current) drug therapy: Secondary | ICD-10-CM | POA: Diagnosis not present

## 2019-07-07 DIAGNOSIS — J029 Acute pharyngitis, unspecified: Secondary | ICD-10-CM | POA: Insufficient documentation

## 2019-07-07 DIAGNOSIS — F17210 Nicotine dependence, cigarettes, uncomplicated: Secondary | ICD-10-CM | POA: Diagnosis not present

## 2019-07-07 DIAGNOSIS — R6883 Chills (without fever): Secondary | ICD-10-CM | POA: Diagnosis not present

## 2019-07-07 DIAGNOSIS — J069 Acute upper respiratory infection, unspecified: Secondary | ICD-10-CM | POA: Diagnosis not present

## 2019-07-07 DIAGNOSIS — Z20822 Contact with and (suspected) exposure to covid-19: Secondary | ICD-10-CM | POA: Diagnosis not present

## 2019-07-07 HISTORY — DX: Other seasonal allergic rhinitis: J30.2

## 2019-07-07 LAB — SARS CORONAVIRUS 2 (TAT 6-24 HRS): SARS Coronavirus 2: NEGATIVE

## 2019-07-07 NOTE — Discharge Instructions (Signed)
Your COVID test is pending.  You should self quarantine until the test result is back.    Take Tylenol as needed for fever or discomfort.  Rest and keep yourself hydrated.    Go to the emergency department if you develop shortness of breath, severe diarrhea, high fever not relieved by Tylenol or ibuprofen, or other concerning symptoms.    

## 2019-07-07 NOTE — ED Triage Notes (Signed)
Pt c/o sore throat, nasal congestion, headache, chills, general fatigue since Sunday. Denies abdom pain, n/v/d, cough.   States h/o seasonal allergies.
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
## 2020-02-02 ENCOUNTER — Other Ambulatory Visit: Payer: Self-pay

## 2020-02-02 ENCOUNTER — Ambulatory Visit (HOSPITAL_COMMUNITY)
Admission: EM | Admit: 2020-02-02 | Discharge: 2020-02-02 | Disposition: A | Discharge status: Home / Self Care | Payer: Commercial Managed Care - PPO | Attending: Family Medicine | Admitting: Family Medicine

## 2020-02-02 DIAGNOSIS — Z20822 Contact with and (suspected) exposure to covid-19: Secondary | ICD-10-CM | POA: Insufficient documentation

## 2020-02-02 LAB — SARS CORONAVIRUS 2 (TAT 6-24 HRS): SARS Coronavirus 2: NEGATIVE

## 2020-02-02 NOTE — ED Triage Notes (Signed)
Pt requests COVID testing 2/2 wife testing positive on Sunday. Pt denies any URI sx, n/v/d, fever, SOB, body aches, loss of taste/smell.

## 2020-07-04 ENCOUNTER — Other Ambulatory Visit: Payer: Self-pay

## 2020-07-04 ENCOUNTER — Encounter (HOSPITAL_COMMUNITY): Payer: Self-pay

## 2020-07-04 ENCOUNTER — Ambulatory Visit (HOSPITAL_COMMUNITY)
Admission: EM | Admit: 2020-07-04 | Discharge: 2020-07-04 | Disposition: A | Discharge status: Home / Self Care | Payer: Commercial Managed Care - PPO | Attending: Family Medicine | Admitting: Family Medicine

## 2020-07-04 DIAGNOSIS — J069 Acute upper respiratory infection, unspecified: Secondary | ICD-10-CM

## 2020-07-04 DIAGNOSIS — J3089 Other allergic rhinitis: Secondary | ICD-10-CM

## 2020-07-04 DIAGNOSIS — R062 Wheezing: Secondary | ICD-10-CM | POA: Diagnosis not present

## 2020-07-04 MED ORDER — ALBUTEROL SULFATE HFA 108 (90 BASE) MCG/ACT IN AERS: 2.0000 | INHALATION_SPRAY | RESPIRATORY_TRACT | 0 refills | Status: DC | PRN

## 2020-07-04 MED ORDER — PREDNISONE 20 MG PO TABS: 40.0000 mg | ORAL_TABLET | Freq: Every day | ORAL | 0 refills | Status: DC

## 2020-07-04 MED ORDER — PROMETHAZINE-DM 6.25-15 MG/5ML PO SYRP: 5.0000 mL | ORAL_SOLUTION | Freq: Four times a day (QID) | ORAL | 0 refills | Status: DC | PRN

## 2020-07-04 NOTE — ED Triage Notes (Signed)
Pt presents with productive cough, shortness of breath, generalized body aches, and sore throat X 1 week.
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
## 2020-12-04 ENCOUNTER — Ambulatory Visit (INDEPENDENT_AMBULATORY_CARE_PROVIDER_SITE_OTHER): Payer: BLUE CROSS/BLUE SHIELD

## 2020-12-04 ENCOUNTER — Encounter (HOSPITAL_COMMUNITY): Payer: Self-pay

## 2020-12-04 ENCOUNTER — Ambulatory Visit (HOSPITAL_COMMUNITY)
Admission: EM | Admit: 2020-12-04 | Discharge: 2020-12-04 | Disposition: A | Discharge status: Home / Self Care | Payer: BLUE CROSS/BLUE SHIELD | Attending: Student | Admitting: Student

## 2020-12-04 DIAGNOSIS — S86911A Strain of unspecified muscle(s) and tendon(s) at lower leg level, right leg, initial encounter: Secondary | ICD-10-CM | POA: Diagnosis not present

## 2020-12-04 DIAGNOSIS — S8391XA Sprain of unspecified site of right knee, initial encounter: Secondary | ICD-10-CM | POA: Diagnosis not present

## 2020-12-04 MED ORDER — KETOROLAC TROMETHAMINE 10 MG PO TABS: 10.0000 mg | ORAL_TABLET | Freq: Four times a day (QID) | ORAL | 0 refills | Status: AC | PRN

## 2020-12-04 NOTE — Discharge Instructions (Addendum)
-  Toradol (Ketorolac), one pill up to every 6 hours for pain. Make sure to take this with food. Avoid other NSAIDs like ibuprofen, alleve, naproxen while taking this medication.  -You can also take Tylenol 1000mg  up to 3x daily -Rest, ice -Use knee brace while walking and standing -If symptoms persist in 3 days, follow-up with an orthopedist. I recommend [facility] at [address], [place], [place]. You can schedule an appointment by calling [phone]) or online [phone]), but they also have a walk-in clinic M-F 8a-8p and Sat 10a-3p.
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
## 2020-12-04 NOTE — ED Triage Notes (Signed)
Pt reports he was playing ball and states he hurt his knee. States it happened yesterday.  For discomfort than pain and c/o discomfort when walking.

## 2021-02-16 ENCOUNTER — Ambulatory Visit (HOSPITAL_COMMUNITY)
Admission: EM | Admit: 2021-02-16 | Discharge: 2021-02-16 | Disposition: A | Discharge status: Home / Self Care | Payer: BLUE CROSS/BLUE SHIELD | Attending: Family Medicine | Admitting: Family Medicine

## 2021-02-16 ENCOUNTER — Other Ambulatory Visit: Payer: Self-pay

## 2021-02-16 ENCOUNTER — Encounter (HOSPITAL_COMMUNITY): Payer: Self-pay | Admitting: Emergency Medicine

## 2021-02-16 DIAGNOSIS — R051 Acute cough: Secondary | ICD-10-CM | POA: Diagnosis not present

## 2021-02-16 DIAGNOSIS — Z20828 Contact with and (suspected) exposure to other viral communicable diseases: Secondary | ICD-10-CM

## 2021-02-16 DIAGNOSIS — R509 Fever, unspecified: Secondary | ICD-10-CM

## 2021-02-16 LAB — POC INFLUENZA A AND B ANTIGEN (URGENT CARE ONLY)
INFLUENZA A ANTIGEN, POC: NEGATIVE
INFLUENZA B ANTIGEN, POC: NEGATIVE

## 2021-02-16 MED ORDER — ACETAMINOPHEN 325 MG PO TABS
650.0000 mg | ORAL_TABLET | Freq: Once | ORAL | Status: AC
  Administered 2021-02-16: 650 mg via ORAL

## 2021-02-16 MED ORDER — OSELTAMIVIR PHOSPHATE 75 MG PO CAPS: 75.0000 mg | ORAL_CAPSULE | Freq: Two times a day (BID) | ORAL | 0 refills | Status: AC

## 2021-02-16 MED ORDER — HYDROCODONE BIT-HOMATROP MBR 5-1.5 MG/5ML PO SOLN: 5.0000 mL | Freq: Four times a day (QID) | ORAL | 0 refills | Status: DC | PRN

## 2021-02-16 MED ORDER — ACETAMINOPHEN 325 MG PO TABS
ORAL_TABLET | ORAL | Status: AC
  Filled 2021-02-16: qty 2
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
## 2021-02-16 NOTE — Discharge Instructions (Signed)

## 2021-02-16 NOTE — ED Triage Notes (Signed)
Pt is  present today with chills, body aches, cough, and sneezing. Pt states sx started this morning

## 2021-07-25 ENCOUNTER — Ambulatory Visit (HOSPITAL_COMMUNITY)
Admission: EM | Admit: 2021-07-25 | Discharge: 2021-07-25 | Disposition: A | Discharge status: Home / Self Care | Payer: Commercial Managed Care - PPO | Attending: Physician Assistant | Admitting: Physician Assistant

## 2021-07-25 ENCOUNTER — Encounter (HOSPITAL_COMMUNITY): Payer: Self-pay

## 2021-07-25 ENCOUNTER — Ambulatory Visit (INDEPENDENT_AMBULATORY_CARE_PROVIDER_SITE_OTHER): Payer: Commercial Managed Care - PPO

## 2021-07-25 DIAGNOSIS — Z20822 Contact with and (suspected) exposure to covid-19: Secondary | ICD-10-CM | POA: Insufficient documentation

## 2021-07-25 DIAGNOSIS — R093 Abnormal sputum: Secondary | ICD-10-CM

## 2021-07-25 DIAGNOSIS — J069 Acute upper respiratory infection, unspecified: Secondary | ICD-10-CM | POA: Diagnosis not present

## 2021-07-25 DIAGNOSIS — R197 Diarrhea, unspecified: Secondary | ICD-10-CM | POA: Diagnosis present

## 2021-07-25 DIAGNOSIS — Z91048 Other nonmedicinal substance allergy status: Secondary | ICD-10-CM | POA: Insufficient documentation

## 2021-07-25 DIAGNOSIS — R051 Acute cough: Secondary | ICD-10-CM | POA: Insufficient documentation

## 2021-07-25 DIAGNOSIS — Z87891 Personal history of nicotine dependence: Secondary | ICD-10-CM | POA: Insufficient documentation

## 2021-07-25 LAB — POC INFLUENZA A AND B ANTIGEN (URGENT CARE ONLY)
INFLUENZA A ANTIGEN, POC: NEGATIVE
INFLUENZA B ANTIGEN, POC: NEGATIVE

## 2021-07-25 MED ORDER — PROMETHAZINE-DM 6.25-15 MG/5ML PO SYRP: 5.0000 mL | ORAL_SOLUTION | Freq: Two times a day (BID) | ORAL | 0 refills | Status: DC | PRN

## 2021-07-25 NOTE — Discharge Instructions (Signed)
Your flu test was negative.  We will contact you if your COVID test is positive.  Please monitor your MyChart for these results.  Use Promethazine DM for cough.  This will make you sleepy so do not drive or drink alcohol with taking it.  You can use over-the-counter medication for additional symptom relief.  Make sure you rest and drink plenty of fluid.  Eat small frequent meals and avoid spicy/acidic/fatty foods.  If your symptoms are not improving within a week please return for reevaluation.  If at any point you have worsening symptoms including high fever, worsening cough, coughing up blood, nausea/vomiting interfering with oral intake, blood in vomit or stool, weakness, chest pain, shortness of breath you need to go to the emergency room immediately. ?
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
## 2021-07-25 NOTE — ED Triage Notes (Signed)
Pt presents with coughing up bloody mucus and diarrhea since waking up this morning. ?

## 2021-07-26 LAB — SARS CORONAVIRUS 2 (TAT 6-24 HRS): SARS Coronavirus 2: NEGATIVE

## 2021-09-07 ENCOUNTER — Other Ambulatory Visit: Payer: Self-pay

## 2021-09-07 ENCOUNTER — Ambulatory Visit (HOSPITAL_COMMUNITY)
Admission: EM | Admit: 2021-09-07 | Discharge: 2021-09-07 | Disposition: A | Discharge status: Home / Self Care | Payer: Commercial Managed Care - PPO | Attending: Family Medicine | Admitting: Family Medicine

## 2021-09-07 ENCOUNTER — Encounter (HOSPITAL_COMMUNITY): Payer: Self-pay | Admitting: Emergency Medicine

## 2021-09-07 DIAGNOSIS — Z79899 Other long term (current) drug therapy: Secondary | ICD-10-CM | POA: Diagnosis not present

## 2021-09-07 DIAGNOSIS — Z20822 Contact with and (suspected) exposure to covid-19: Secondary | ICD-10-CM | POA: Insufficient documentation

## 2021-09-07 DIAGNOSIS — J069 Acute upper respiratory infection, unspecified: Secondary | ICD-10-CM

## 2021-09-07 DIAGNOSIS — R051 Acute cough: Secondary | ICD-10-CM | POA: Insufficient documentation

## 2021-09-07 MED ORDER — PROMETHAZINE-DM 6.25-15 MG/5ML PO SYRP: 5.0000 mL | ORAL_SOLUTION | Freq: Four times a day (QID) | ORAL | 0 refills | Status: AC | PRN

## 2021-09-07 NOTE — Discharge Instructions (Signed)
You have been tested for COVID-19 today. °If your test returns positive, you will receive a phone call from [place] regarding your results. °Negative test results are not called. °Both positive and negative results area always visible on MyChart. °If you do not have a MyChart account, sign up instructions are provided in your discharge papers. °Please do not hesitate to contact us should you have questions or concerns. ° °

## 2021-09-07 NOTE — ED Triage Notes (Signed)
Cough and congestion for 3 days.  Initially had sore throat, but not now.  Frequent clearing of throat.  Chest hurts with coughing and throat burning.  Complains of fatigue.    Has taken claritin
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
## 2021-09-07 NOTE — ED Notes (Signed)
Labeled specimen in lab °

## 2021-09-08 LAB — SARS CORONAVIRUS 2 (TAT 6-24 HRS): SARS Coronavirus 2: NEGATIVE

## 2023-03-20 ENCOUNTER — Other Ambulatory Visit: Payer: Self-pay

## 2023-03-20 ENCOUNTER — Emergency Department (HOSPITAL_COMMUNITY)
Admission: EM | Admit: 2023-03-20 | Discharge: 2023-03-20 | Discharge status: Against Medical Advice | Payer: Commercial Managed Care - PPO | Attending: Emergency Medicine | Admitting: Emergency Medicine

## 2023-03-20 DIAGNOSIS — R42 Dizziness and giddiness: Secondary | ICD-10-CM | POA: Insufficient documentation

## 2023-03-20 DIAGNOSIS — Z5321 Procedure and treatment not carried out due to patient leaving prior to being seen by health care provider: Secondary | ICD-10-CM | POA: Insufficient documentation

## 2023-03-20 DIAGNOSIS — H532 Diplopia: Secondary | ICD-10-CM | POA: Insufficient documentation

## 2023-03-20 LAB — COMPREHENSIVE METABOLIC PANEL
ALT: 14 U/L (ref 0–44)
AST: 18 U/L (ref 15–41)
Albumin: 3.5 g/dL (ref 3.5–5.0)
Alkaline Phosphatase: 53 U/L (ref 38–126)
Anion gap: 10 (ref 5–15)
BUN: 9 mg/dL (ref 6–20)
CO2: 23 mmol/L (ref 22–32)
Calcium: 8.8 mg/dL — ABNORMAL LOW (ref 8.9–10.3)
Chloride: 104 mmol/L (ref 98–111)
Creatinine, Ser: 0.89 mg/dL (ref 0.61–1.24)
GFR, Estimated: >60 mL/min (ref >60)
Glucose, Bld: 111 mg/dL — ABNORMAL HIGH (ref 70–99)
Potassium: 3.2 mmol/L — ABNORMAL LOW (ref 3.5–5.1)
Sodium: 137 mmol/L (ref 135–145)
Total Bilirubin: 0.7 mg/dL (ref <1.2)
Total Protein: 7.8 g/dL (ref 6.5–8.1)

## 2023-03-20 LAB — CBC
HCT: 42.5 % (ref 39.0–52.0)
Hemoglobin: 13.9 g/dL (ref 13.0–17.0)
MCH: 26.6 pg (ref 26.0–34.0)
MCHC: 32.7 g/dL (ref 30.0–36.0)
MCV: 81.4 fL (ref 80.0–100.0)
Platelets: 385 10*3/uL (ref 150–400)
RBC: 5.22 MIL/uL (ref 4.22–5.81)
RDW: 13.2 % (ref 11.5–15.5)
WBC: 5 10*3/uL (ref 4.0–10.5)
nRBC: 0 % (ref 0.0–0.2)

## 2023-03-20 LAB — CBG MONITORING, ED: Glucose-Capillary: 104 mg/dL — ABNORMAL HIGH (ref 70–99)

## 2023-03-20 LAB — TROPONIN I (HIGH SENSITIVITY): Troponin I (High Sensitivity): 4 ng/L (ref <18)

## 2023-03-20 NOTE — ED Notes (Signed)
Pt seen walking out the lobby with significant other.

## 2023-03-20 NOTE — ED Notes (Signed)
Called for second trop no answer

## 2023-03-20 NOTE — ED Notes (Signed)
Pt name called 3x no answer

## 2023-03-20 NOTE — ED Notes (Signed)
Family at bedside. 

## 2023-03-20 NOTE — ED Notes (Signed)
Called for MSE signature with no answer

## 2023-03-20 NOTE — ED Triage Notes (Signed)
Pt arrives to ED c/o diploma and dizziness since midnight. PT states that symptoms happened when waking from sleep. LKN 2330. Pt reports seeing floaters and having one episode of N/V. Pt denies CP, SOB, LOC

## 2024-01-24 IMAGING — DX DG CHEST 2V
2 series · 2 of 2 positions shown · non-contrast
Comparison: 10/29/2017

CLINICAL DATA: Blood-streaked sputum

EXAM:
CHEST - 2 VIEW

[chest pa]
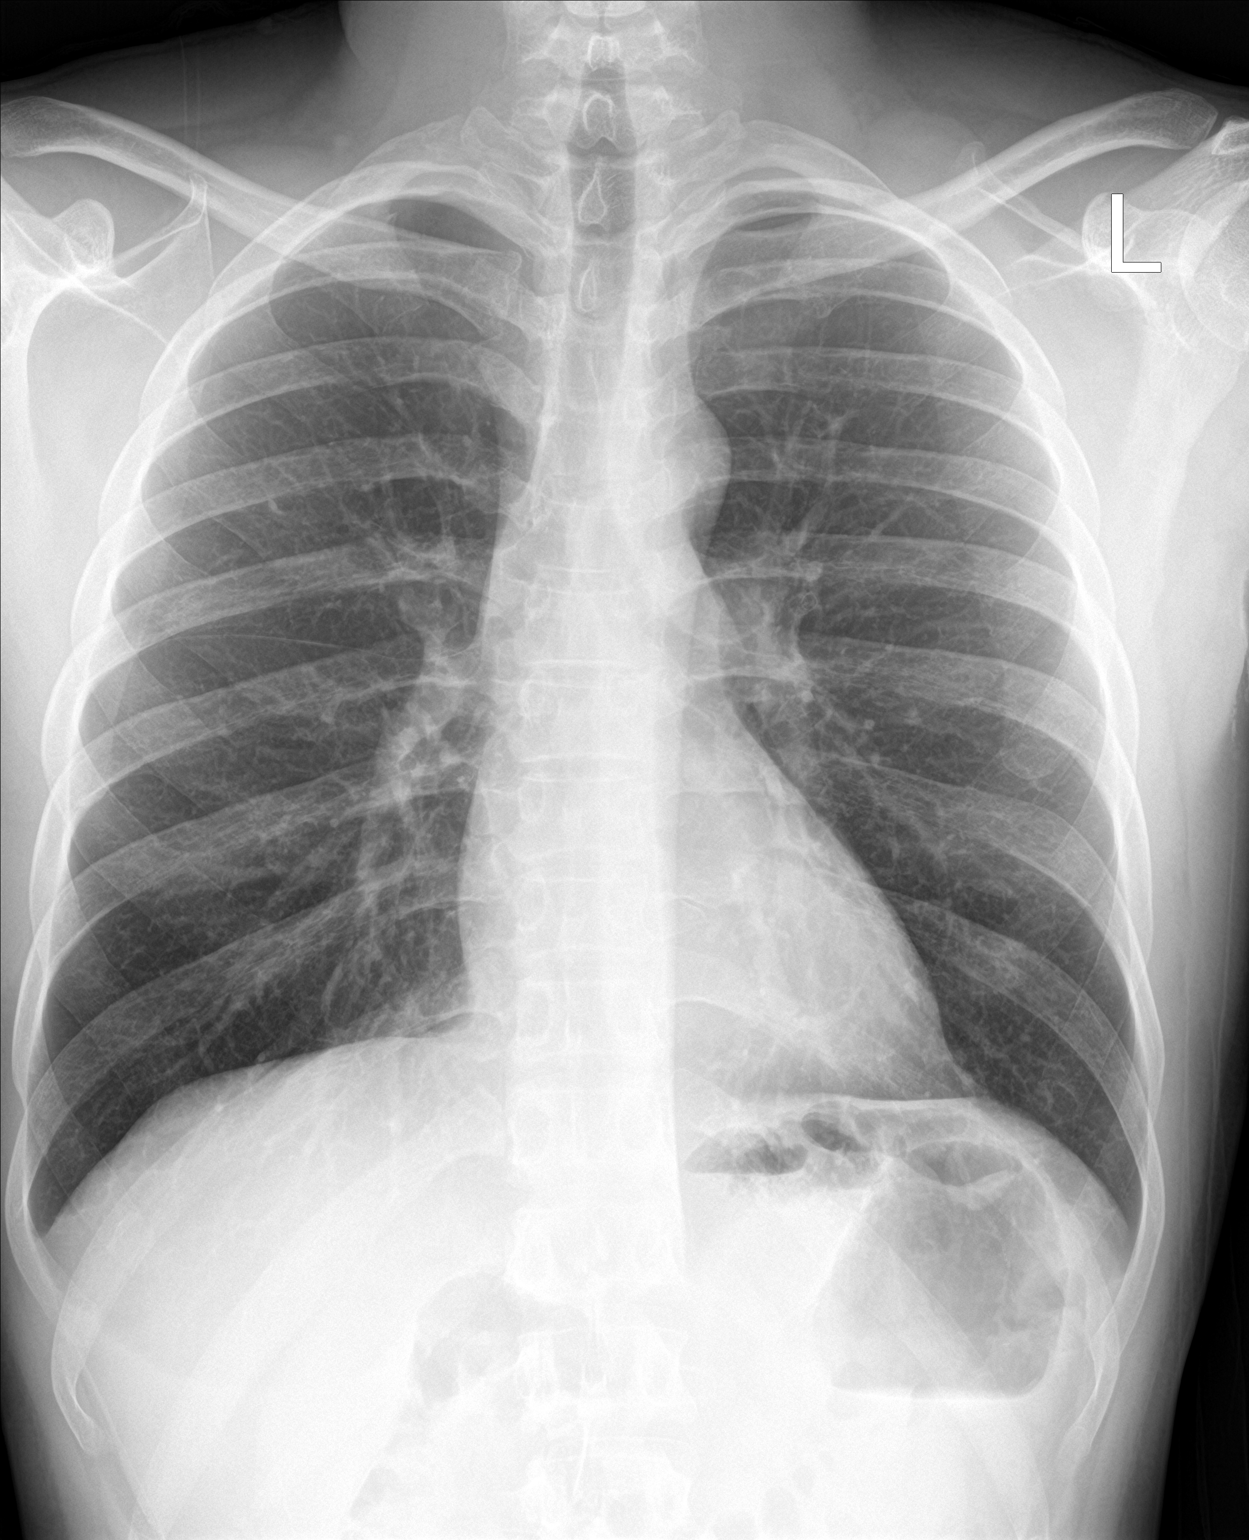

[chest lat]
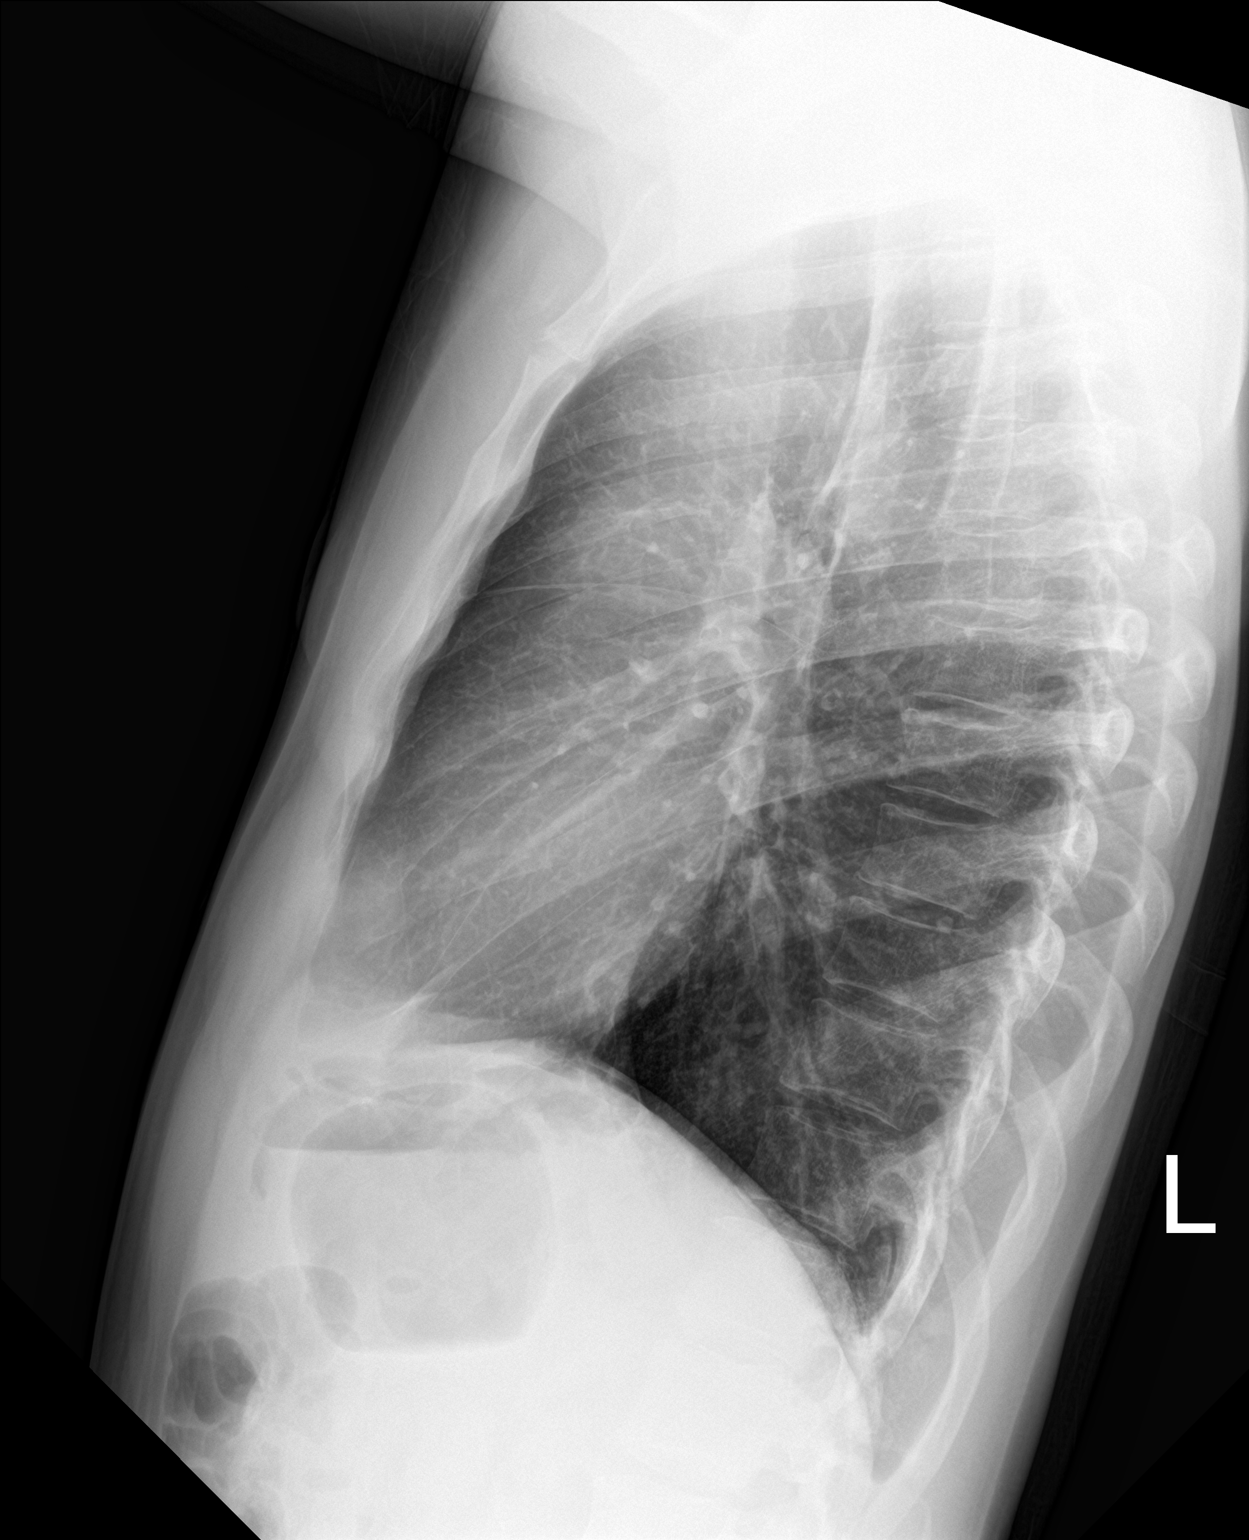

[2 of 2 positions shown; findings below may reference images not displayed]

FINDINGS: The heart size and mediastinal contours are within normal limits.
Both lungs are clear. The visualized skeletal structures are
unremarkable.
IMPRESSION: No active cardiopulmonary disease.

## 2024-03-10 ENCOUNTER — Encounter (HOSPITAL_COMMUNITY): Payer: Self-pay

## 2024-03-10 ENCOUNTER — Ambulatory Visit (HOSPITAL_COMMUNITY)
Admission: EM | Admit: 2024-03-10 | Discharge: 2024-03-10 | Disposition: A | Discharge status: Home / Self Care | Attending: Physician Assistant | Admitting: Physician Assistant

## 2024-03-10 DIAGNOSIS — R059 Cough, unspecified: Secondary | ICD-10-CM

## 2024-03-10 DIAGNOSIS — J069 Acute upper respiratory infection, unspecified: Secondary | ICD-10-CM

## 2024-03-10 LAB — POCT INFLUENZA A/B
Influenza A, POC: NEGATIVE
Influenza B, POC: NEGATIVE

## 2024-03-10 LAB — POC SOFIA SARS ANTIGEN FIA: SARS Coronavirus 2 Ag: NEGATIVE

## 2024-03-10 MED ORDER — ALBUTEROL SULFATE HFA 108 (90 BASE) MCG/ACT IN AERS
2.0000 | INHALATION_SPRAY | Freq: Four times a day (QID) | RESPIRATORY_TRACT | 2 refills | Status: AC | PRN
Start: 2024-03-10 — End: ?
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
## 2024-03-10 NOTE — Discharge Instructions (Addendum)
 Return if any problems.

## 2024-03-10 NOTE — ED Triage Notes (Addendum)
 Patient states a 4-day history of cough with green congestion,sore throat,headache and chills. Taking OTC medication. Denies any diarrhea or vomiting.
